# Patient Record
Sex: Female | Born: 1980 | Race: Black or African American | Hispanic: No | Marital: Married | Smoking: Never smoker
Health system: Southern US, Community
[De-identification: ages and names within clinical notes are randomized; demographics above are authoritative.]

## PROBLEM LIST (undated history)

## (undated) DIAGNOSIS — E079 Disorder of thyroid, unspecified: Secondary | ICD-10-CM

## (undated) DIAGNOSIS — D649 Anemia, unspecified: Secondary | ICD-10-CM

## (undated) HISTORY — PX: NO PAST SURGERIES: SHX2092

## (undated) HISTORY — PX: OTHER SURGICAL HISTORY: SHX169

---

## 2000-08-13 ENCOUNTER — Ambulatory Visit (HOSPITAL_COMMUNITY): Admission: RE | Admit: 2000-08-13 | Discharge: 2000-08-13 | Payer: Self-pay | Admitting: *Deleted

## 2000-08-21 ENCOUNTER — Encounter: Admission: RE | Admit: 2000-08-21 | Discharge: 2000-08-21 | Payer: Self-pay | Admitting: Obstetrics

## 2000-09-03 ENCOUNTER — Encounter: Admission: RE | Admit: 2000-09-03 | Discharge: 2000-09-03 | Payer: Self-pay | Admitting: Obstetrics & Gynecology

## 2000-09-16 ENCOUNTER — Ambulatory Visit (HOSPITAL_COMMUNITY): Admission: RE | Admit: 2000-09-16 | Discharge: 2000-09-16 | Payer: Self-pay | Admitting: Obstetrics & Gynecology

## 2000-09-17 ENCOUNTER — Encounter: Admission: RE | Admit: 2000-09-17 | Discharge: 2000-09-17 | Payer: Self-pay | Admitting: Obstetrics & Gynecology

## 2000-10-01 ENCOUNTER — Encounter: Admission: RE | Admit: 2000-10-01 | Discharge: 2000-10-01 | Payer: Self-pay | Admitting: Obstetrics & Gynecology

## 2000-10-08 ENCOUNTER — Ambulatory Visit (HOSPITAL_COMMUNITY): Admission: RE | Admit: 2000-10-08 | Discharge: 2000-10-08 | Payer: Self-pay | Admitting: Obstetrics & Gynecology

## 2000-10-08 ENCOUNTER — Encounter: Admission: RE | Admit: 2000-10-08 | Discharge: 2000-10-08 | Payer: Self-pay | Admitting: Obstetrics & Gynecology

## 2000-10-14 ENCOUNTER — Encounter (HOSPITAL_COMMUNITY): Admission: RE | Admit: 2000-10-14 | Discharge: 2000-10-27 | Payer: Self-pay | Admitting: *Deleted

## 2000-10-23 ENCOUNTER — Encounter: Payer: Self-pay | Admitting: *Deleted

## 2000-10-25 ENCOUNTER — Encounter: Payer: Self-pay | Admitting: Obstetrics

## 2000-10-25 ENCOUNTER — Inpatient Hospital Stay (HOSPITAL_COMMUNITY): Admission: AD | Admit: 2000-10-25 | Discharge: 2000-10-27 | Payer: Self-pay | Admitting: *Deleted

## 2000-10-25 ENCOUNTER — Encounter (INDEPENDENT_AMBULATORY_CARE_PROVIDER_SITE_OTHER): Payer: Self-pay | Admitting: Specialist

## 2000-10-29 ENCOUNTER — Encounter: Admission: RE | Admit: 2000-10-29 | Discharge: 2000-11-28 | Payer: Self-pay | Admitting: Obstetrics

## 2004-04-20 ENCOUNTER — Inpatient Hospital Stay (HOSPITAL_COMMUNITY): Admission: AD | Admit: 2004-04-20 | Discharge: 2004-04-20 | Payer: Self-pay | Admitting: *Deleted

## 2004-04-21 ENCOUNTER — Inpatient Hospital Stay (HOSPITAL_COMMUNITY): Admission: AD | Admit: 2004-04-21 | Discharge: 2004-04-23 | Payer: Self-pay | Admitting: Obstetrics

## 2007-09-26 ENCOUNTER — Inpatient Hospital Stay (HOSPITAL_COMMUNITY): Admission: AD | Admit: 2007-09-26 | Discharge: 2007-09-26 | Payer: Self-pay | Admitting: Obstetrics & Gynecology

## 2007-11-09 ENCOUNTER — Inpatient Hospital Stay (HOSPITAL_COMMUNITY): Admission: AD | Admit: 2007-11-09 | Discharge: 2007-11-11 | Payer: Self-pay | Admitting: Obstetrics & Gynecology

## 2007-11-09 ENCOUNTER — Ambulatory Visit: Payer: Self-pay | Admitting: Obstetrics & Gynecology

## 2008-03-28 ENCOUNTER — Emergency Department (HOSPITAL_COMMUNITY): Admission: EM | Admit: 2008-03-28 | Discharge: 2008-03-29 | Payer: Self-pay | Admitting: Emergency Medicine

## 2010-09-09 NOTE — L&D Delivery Note (Signed)
Delivery Note At 8:05 AM a viable and healthy female was delivered via Vaginal, Spontaneous Delivery (ROA).  APGAR: 8, 9; weight 8 lb 13.5 oz (4010 g).   Placenta status: Intact, Spontaneous.  Cord: 3 vessels with the following complications: None.  Cord pH: n/a  Anesthesia: Epidural  Episiotomy: None Lacerations: None Suture Repair: n/a Est. Blood Loss (mL): 400  Mom to postpartum.  Baby to nursery-stable.  [facility] 04/06/2011, 8:26 AM

## 2010-11-30 ENCOUNTER — Ambulatory Visit (HOSPITAL_COMMUNITY)
Admission: AD | Admit: 2010-11-30 | Discharge: 2010-12-01 | DRG: 781 | Disposition: A | Discharge status: Home / Self Care | Payer: Medicaid Other | Source: Ambulatory Visit | Attending: Obstetrics & Gynecology | Admitting: Obstetrics & Gynecology

## 2010-11-30 ENCOUNTER — Inpatient Hospital Stay (HOSPITAL_COMMUNITY): Payer: Medicaid Other

## 2010-11-30 DIAGNOSIS — O99019 Anemia complicating pregnancy, unspecified trimester: Secondary | ICD-10-CM

## 2010-11-30 DIAGNOSIS — D509 Iron deficiency anemia, unspecified: Secondary | ICD-10-CM

## 2010-11-30 LAB — FERRITIN: Ferritin: 2 ng/mL — ABNORMAL LOW (ref 10–291)

## 2010-11-30 LAB — COMPREHENSIVE METABOLIC PANEL
Alkaline Phosphatase: 51 U/L (ref 39–117)
BUN: 5 mg/dL — ABNORMAL LOW (ref 6–23)
CO2: 22 mEq/L (ref 19–32)
Chloride: 105 mEq/L (ref 96–112)
Creatinine, Ser: 0.36 mg/dL — ABNORMAL LOW (ref 0.4–1.2)
GFR calc non Af Amer: >60 mL/min (ref >60)
Glucose, Bld: 66 mg/dL — ABNORMAL LOW (ref 70–99)
Potassium: 3.4 mEq/L — ABNORMAL LOW (ref 3.5–5.1)
Total Bilirubin: 0.1 mg/dL — ABNORMAL LOW (ref 0.3–1.2)

## 2010-11-30 LAB — HIV ANTIBODY (ROUTINE TESTING W REFLEX): HIV: NONREACTIVE

## 2010-11-30 LAB — DIFFERENTIAL
Basophils Absolute: 0 10*3/uL (ref 0.0–0.1)
Basophils Relative: 0 % (ref 0–1)
Eosinophils Absolute: 0.1 10*3/uL (ref 0.0–0.7)
Eosinophils Relative: 1 % (ref 0–5)
Lymphocytes Relative: 23 % (ref 12–46)
Lymphs Abs: 1.9 10*3/uL (ref 0.7–4.0)
Monocytes Absolute: 0.4 10*3/uL (ref 0.1–1.0)
Monocytes Relative: 5 % (ref 3–12)
Neutro Abs: 5.7 10*3/uL (ref 1.7–7.7)
Neutrophils Relative %: 71 % (ref 43–77)

## 2010-11-30 LAB — CBC
MCHC: 26.7 g/dL — ABNORMAL LOW (ref 30.0–36.0)
Platelets: 243 10*3/uL (ref 150–400)
RDW: 20.6 % — ABNORMAL HIGH (ref 11.5–15.5)
WBC: 8.1 10*3/uL (ref 4.0–10.5)

## 2010-11-30 LAB — IRON AND TIBC
Iron: 14 ug/dL — ABNORMAL LOW (ref 42–135)
TIBC: 215 ug/dL — ABNORMAL LOW (ref 250–470)

## 2010-11-30 LAB — VITAMIN B12: Vitamin B-12: 338 pg/mL (ref 211–911)

## 2010-11-30 LAB — TSH: TSH: 3.361 u[IU]/mL (ref 0.350–4.500)

## 2010-11-30 LAB — RETICULOCYTES
RBC.: 3.71 MIL/uL — ABNORMAL LOW (ref 3.87–5.11)
Retic Count, Absolute: 55.7 10*3/uL (ref 19.0–186.0)

## 2010-11-30 LAB — RPR: RPR Ser Ql: NONREACTIVE

## 2010-12-03 ENCOUNTER — Ambulatory Visit (HOSPITAL_COMMUNITY)
Admission: AD | Admit: 2010-12-03 | Discharge: 2010-12-03 | Disposition: A | Discharge status: Home / Self Care | Payer: Medicaid Other | Source: Ambulatory Visit | Attending: Obstetrics & Gynecology | Admitting: Obstetrics & Gynecology

## 2010-12-03 DIAGNOSIS — D649 Anemia, unspecified: Secondary | ICD-10-CM | POA: Insufficient documentation

## 2010-12-03 DIAGNOSIS — O99019 Anemia complicating pregnancy, unspecified trimester: Principal | ICD-10-CM | POA: Insufficient documentation

## 2010-12-04 LAB — CROSSMATCH
Unit division: 0
Unit division: 0

## 2010-12-05 ENCOUNTER — Ambulatory Visit (HOSPITAL_COMMUNITY)
Admission: AD | Admit: 2010-12-05 | Discharge: 2010-12-05 | Disposition: A | Discharge status: Home / Self Care | Payer: Medicaid Other | Source: Ambulatory Visit | Attending: Obstetrics and Gynecology | Admitting: Obstetrics and Gynecology

## 2010-12-05 DIAGNOSIS — D649 Anemia, unspecified: Secondary | ICD-10-CM | POA: Insufficient documentation

## 2010-12-05 DIAGNOSIS — O99019 Anemia complicating pregnancy, unspecified trimester: Principal | ICD-10-CM | POA: Insufficient documentation

## 2010-12-17 NOTE — Discharge Summary (Signed)
Karen Berry, Karen Berry               ACCOUNT NO.:  [id number]  MEDICAL RECORD NO.:  [id number]           PATIENT TYPE:  I  LOCATION:  9371                          FACILITY:  [facility]  PHYSICIAN:  Horton Chin, MD DATE OF BIRTH:  May 14, 1981  DATE OF ADMISSION:  11/30/2010 DATE OF DISCHARGE:  12/01/2010                              DISCHARGE SUMMARY   ADMISSION DIAGNOSES: 1. Intrauterine pregnancy at 22-2/7th weeks' gestation, estimated due     date of April 03, 2011. 2. Symptomatic anemia with a hemoglobin of 5.8 and hematocrit of 21.7.  DISCHARGE DIAGNOSES: 1. Intrauterine pregnancy at 22-3/7th weeks' gestation. 2. The patient declined transfusion. 3. Status post intravenous iron infusion.  PERTINENT LABS AND STUDIES:  Admission hemoglobin of 5.8, hematocrit 21.7, platelet count of 243,000, retic count of 1.5%.  Vitamin B12 338 which is within normal limits.  Iron 14, total iron binding capacity of 215, percent saturation of 7, ferritin of 2 which confirms iron  deficiency anemia. Hemoglobin B surface antigen negative, RPR nonreactive, rubella immune.  Ultrasound that was done on November 30, 2010 showed an estimated fetal weight of 568 g in the 62nd percentile.  Anterior placenta, normal placenta cord insertion, normal amniotic fluid, fetus appears to be female, normal basic anatomy, cervix length 4.7 cm with normal appearance.  BRIEF HOSPITAL COURSE:  The patient is a 31 year old gravida 4, para 4-0- 0-5 who was seen in the MAU for report of fatigue and dizziness and palpitations.  The patient was sent from the health department and had late prenatal care.  At the health department yesterday given her symptoms, she was sent to maternity admissions for further evaluation and at that point, she was found to have a hemoglobin of 5.8 with the studies as reported concerning for iron deficiency anemia.  Also the patient did report having PICA.  Given her profound anemia and  symptoms, the patient was offered a blood transfusion, which she vehemently refused.  The second option that was given to her was intravenous iron infusion, which she agreed too.  She was admitted to the [facility] for this infusion.  She received Venofer 400 mg and tolerated the infusion well for over 2-1/2 hours.  The recommendation for her to get two additional doses of 400 mg spaced at least 48 hours apart.  The patient was hemodynamically stable while she was admitted and observed. Her baby had a heart rate in 140s.  She had no other obstetric symptoms or concerns.  Given that she was stable and still refusing a transfusion, the patient was sent home after her intravenous iron infusion.  DISCHARGE INSTRUCTIONS:  The patient was given routine antenatal discharge instructions.  She was also told to come in if she has any concerning symptoms such as bleeding or contractions, loss of fluid, decreased fetal movement.  The patient will come back on Monday, December 03, 2010 and Wednesday, December 05, 2010 for her subsequent Venofer infusions.  She was told to continue to think about possibly getting the transfusion, but she still refuses at this point.  Anemia precautions were given to the patient and she was  told to call or come back in for any further obstetric concerns.     Horton Chin, MD     UAA/MEDQ  D:  12/01/2010  T:  12/02/2010  Job:  [id number]  Electronically Signed by Jaynie Collins MD on 12/17/2010 11:42:11 AM

## 2011-02-01 ENCOUNTER — Inpatient Hospital Stay (HOSPITAL_COMMUNITY)
Admission: AD | Admit: 2011-02-01 | Discharge: 2011-02-01 | Disposition: A | Discharge status: Home / Self Care | Payer: Medicaid Other | Source: Ambulatory Visit | Attending: Obstetrics & Gynecology | Admitting: Obstetrics & Gynecology

## 2011-02-01 DIAGNOSIS — IMO0001 Reserved for inherently not codable concepts without codable children: Secondary | ICD-10-CM

## 2011-02-01 DIAGNOSIS — D649 Anemia, unspecified: Secondary | ICD-10-CM

## 2011-02-01 DIAGNOSIS — O99019 Anemia complicating pregnancy, unspecified trimester: Secondary | ICD-10-CM | POA: Insufficient documentation

## 2011-02-01 DIAGNOSIS — O99891 Other specified diseases and conditions complicating pregnancy: Secondary | ICD-10-CM | POA: Insufficient documentation

## 2011-02-01 DIAGNOSIS — O9989 Other specified diseases and conditions complicating pregnancy, childbirth and the puerperium: Secondary | ICD-10-CM

## 2011-02-01 LAB — URINALYSIS, ROUTINE W REFLEX MICROSCOPIC
Ketones, ur: 15 mg/dL — AB
Nitrite: NEGATIVE
Specific Gravity, Urine: 1.025 (ref 1.005–1.030)
Urobilinogen, UA: 0.2 mg/dL (ref 0.0–1.0)
pH: 6.5 (ref 5.0–8.0)

## 2011-02-01 LAB — DIFFERENTIAL
Basophils Relative: 0 % (ref 0–1)
Eosinophils Absolute: 0.1 10*3/uL (ref 0.0–0.7)
Eosinophils Relative: 2 % (ref 0–5)
Monocytes Absolute: 0.9 10*3/uL (ref 0.1–1.0)
Monocytes Relative: 10 % (ref 3–12)
Neutro Abs: 6 10*3/uL (ref 1.7–7.7)

## 2011-02-01 LAB — WET PREP, GENITAL
Clue Cells Wet Prep HPF POC: NONE SEEN
Trich, Wet Prep: NONE SEEN
Yeast Wet Prep HPF POC: NONE SEEN

## 2011-02-01 LAB — CBC
Hemoglobin: 10.7 g/dL — ABNORMAL LOW (ref 12.0–15.0)
MCH: 25.5 pg — ABNORMAL LOW (ref 26.0–34.0)
MCHC: 32.7 g/dL (ref 30.0–36.0)
Platelets: 196 10*3/uL (ref 150–400)
RDW: 26.2 % — ABNORMAL HIGH (ref 11.5–15.5)

## 2011-02-01 LAB — COMPREHENSIVE METABOLIC PANEL
Albumin: 2.4 g/dL — ABNORMAL LOW (ref 3.5–5.2)
Alkaline Phosphatase: 99 U/L (ref 39–117)
BUN: 7 mg/dL (ref 6–23)
Calcium: 8.5 mg/dL (ref 8.4–10.5)
Potassium: 3.3 mEq/L — ABNORMAL LOW (ref 3.5–5.1)
Total Protein: 5.9 g/dL — ABNORMAL LOW (ref 6.0–8.3)

## 2011-02-01 LAB — RETICULOCYTES
RBC.: 4.23 MIL/uL (ref 3.87–5.11)
Retic Count, Absolute: 59.2 10*3/uL (ref 19.0–186.0)

## 2011-02-02 LAB — IRON: Iron: 28 ug/dL — ABNORMAL LOW (ref 42–135)

## 2011-02-03 LAB — URINE CULTURE

## 2011-02-27 ENCOUNTER — Other Ambulatory Visit: Payer: Self-pay | Admitting: Advanced Practice Midwife

## 2011-02-27 ENCOUNTER — Ambulatory Visit: Payer: Medicaid Other

## 2011-02-27 ENCOUNTER — Other Ambulatory Visit: Payer: Self-pay | Admitting: Obstetrics and Gynecology

## 2011-02-27 DIAGNOSIS — O99019 Anemia complicating pregnancy, unspecified trimester: Secondary | ICD-10-CM

## 2011-02-27 LAB — POCT URINALYSIS DIP (DEVICE)
Bilirubin Urine: NEGATIVE
Glucose, UA: NEGATIVE mg/dL
Hgb urine dipstick: NEGATIVE
Ketones, ur: NEGATIVE mg/dL
Specific Gravity, Urine: 1.02 (ref 1.005–1.030)
pH: 6.5 (ref 5.0–8.0)

## 2011-03-11 ENCOUNTER — Other Ambulatory Visit: Payer: Self-pay | Admitting: Obstetrics and Gynecology

## 2011-03-11 DIAGNOSIS — O99019 Anemia complicating pregnancy, unspecified trimester: Secondary | ICD-10-CM

## 2011-03-11 DIAGNOSIS — O0990 Supervision of high risk pregnancy, unspecified, unspecified trimester: Secondary | ICD-10-CM

## 2011-03-20 ENCOUNTER — Other Ambulatory Visit: Payer: Self-pay | Admitting: Obstetrics & Gynecology

## 2011-03-20 DIAGNOSIS — D509 Iron deficiency anemia, unspecified: Secondary | ICD-10-CM

## 2011-03-20 DIAGNOSIS — O0930 Supervision of pregnancy with insufficient antenatal care, unspecified trimester: Secondary | ICD-10-CM

## 2011-03-27 ENCOUNTER — Other Ambulatory Visit: Payer: Self-pay | Admitting: Obstetrics and Gynecology

## 2011-03-27 DIAGNOSIS — O0930 Supervision of pregnancy with insufficient antenatal care, unspecified trimester: Secondary | ICD-10-CM

## 2011-03-27 LAB — POCT URINALYSIS DIP (DEVICE)
Bilirubin Urine: NEGATIVE
Ketones, ur: NEGATIVE mg/dL
Protein, ur: NEGATIVE mg/dL
Specific Gravity, Urine: 1.015 (ref 1.005–1.030)
pH: 7 (ref 5.0–8.0)

## 2011-04-03 ENCOUNTER — Other Ambulatory Visit: Payer: Self-pay | Admitting: Family

## 2011-04-03 ENCOUNTER — Other Ambulatory Visit: Payer: Self-pay | Admitting: Physician Assistant

## 2011-04-03 DIAGNOSIS — IMO0002 Reserved for concepts with insufficient information to code with codable children: Secondary | ICD-10-CM

## 2011-04-03 DIAGNOSIS — O480 Post-term pregnancy: Secondary | ICD-10-CM

## 2011-04-03 DIAGNOSIS — D509 Iron deficiency anemia, unspecified: Secondary | ICD-10-CM

## 2011-04-03 DIAGNOSIS — O0930 Supervision of pregnancy with insufficient antenatal care, unspecified trimester: Secondary | ICD-10-CM

## 2011-04-03 DIAGNOSIS — O365990 Maternal care for other known or suspected poor fetal growth, unspecified trimester, not applicable or unspecified: Secondary | ICD-10-CM

## 2011-04-03 LAB — POCT URINALYSIS DIP (DEVICE)
Hgb urine dipstick: NEGATIVE
Ketones, ur: NEGATIVE mg/dL
Protein, ur: NEGATIVE mg/dL
Specific Gravity, Urine: 1.015 (ref 1.005–1.030)
Urobilinogen, UA: 0.2 mg/dL (ref 0.0–1.0)

## 2011-04-04 ENCOUNTER — Ambulatory Visit (HOSPITAL_COMMUNITY)
Admission: RE | Admit: 2011-04-04 | Discharge: 2011-04-04 | Disposition: A | Discharge status: Home / Self Care | Payer: Medicaid Other | Source: Ambulatory Visit | Attending: Family | Admitting: Family

## 2011-04-04 DIAGNOSIS — IMO0002 Reserved for concepts with insufficient information to code with codable children: Secondary | ICD-10-CM

## 2011-04-04 DIAGNOSIS — O480 Post-term pregnancy: Secondary | ICD-10-CM | POA: Insufficient documentation

## 2011-04-04 DIAGNOSIS — Z3689 Encounter for other specified antenatal screening: Secondary | ICD-10-CM | POA: Insufficient documentation

## 2011-04-05 ENCOUNTER — Encounter (HOSPITAL_COMMUNITY): Payer: Self-pay | Admitting: Anesthesiology

## 2011-04-05 ENCOUNTER — Inpatient Hospital Stay (HOSPITAL_COMMUNITY): Payer: Medicaid Other | Admitting: Anesthesiology

## 2011-04-05 ENCOUNTER — Inpatient Hospital Stay (HOSPITAL_COMMUNITY)
Admission: AD | Admit: 2011-04-05 | Discharge: 2011-04-08 | DRG: 775 | Disposition: A | Discharge status: Home / Self Care | Payer: Medicaid Other | Source: Ambulatory Visit | Attending: Obstetrics & Gynecology | Admitting: Obstetrics & Gynecology

## 2011-04-05 ENCOUNTER — Encounter (HOSPITAL_COMMUNITY): Payer: Self-pay | Admitting: *Deleted

## 2011-04-05 DIAGNOSIS — IMO0001 Reserved for inherently not codable concepts without codable children: Secondary | ICD-10-CM

## 2011-04-05 HISTORY — DX: Anemia, unspecified: D64.9

## 2011-04-05 LAB — CBC
HCT: 39.2 % (ref 36.0–46.0)
MCV: 82.4 fL (ref 78.0–100.0)
RBC: 4.76 MIL/uL (ref 3.87–5.11)
WBC: 10.3 10*3/uL (ref 4.0–10.5)

## 2011-04-05 LAB — RPR: RPR: NONREACTIVE

## 2011-04-05 MED ORDER — LACTATED RINGERS IV SOLN
INTRAVENOUS | Status: DC
  Administered 2011-04-05 (×2): via INTRAVENOUS
  Administered 2011-04-05: 125 mL/h via INTRAVENOUS
  Administered 2011-04-06: 04:00:00 via INTRAVENOUS

## 2011-04-05 MED ORDER — LIDOCAINE HCL 1.5 % IJ SOLN
INTRAMUSCULAR | Status: DC | PRN
  Administered 2011-04-05: 5 mL via INTRADERMAL
  Administered 2011-04-05: 3 mL via INTRADERMAL
  Administered 2011-04-05: 5 mL via INTRADERMAL
  Administered 2011-04-05: 2 mL via INTRADERMAL

## 2011-04-05 MED ORDER — LACTATED RINGERS IV SOLN
500.0000 mL | INTRAVENOUS | Status: DC | PRN
  Administered 2011-04-06: 250 mL via INTRAVENOUS

## 2011-04-05 MED ORDER — LACTATED RINGERS IV SOLN
500.0000 mL | Freq: Once | INTRAVENOUS | Status: AC
  Administered 2011-04-05: 1000 mL via INTRAVENOUS

## 2011-04-05 MED ORDER — CITRIC ACID-SODIUM CITRATE 334-500 MG/5ML PO SOLN: 30.0000 mL | ORAL | Status: DC | PRN

## 2011-04-05 MED ORDER — DIPHENHYDRAMINE HCL 50 MG/ML IJ SOLN: 12.5000 mg | INTRAMUSCULAR | Status: DC | PRN

## 2011-04-05 MED ORDER — PHENYLEPHRINE 40 MCG/ML (10ML) SYRINGE FOR IV PUSH (FOR BLOOD PRESSURE SUPPORT)
80.0000 ug | PREFILLED_SYRINGE | INTRAVENOUS | Status: DC | PRN
  Filled 2011-04-05 (×2): qty 5

## 2011-04-05 MED ORDER — FENTANYL 2.5 MCG/ML BUPIVACAINE 1/10 % EPIDURAL INFUSION (WH - ANES)
14.0000 mL/h | INTRAMUSCULAR | Status: DC
  Administered 2011-04-05 – 2011-04-06 (×4): 14 mL/h via EPIDURAL
  Filled 2011-04-05 (×4): qty 60

## 2011-04-05 MED ORDER — ZOLPIDEM TARTRATE 10 MG PO TABS: 10.0000 mg | ORAL_TABLET | Freq: Every evening | ORAL | Status: DC | PRN

## 2011-04-05 MED ORDER — ONDANSETRON HCL 4 MG/2ML IJ SOLN: 4.0000 mg | Freq: Four times a day (QID) | INTRAMUSCULAR | Status: DC | PRN

## 2011-04-05 MED ORDER — EPHEDRINE 5 MG/ML INJ
10.0000 mg | INTRAVENOUS | Status: DC | PRN
Start: 2011-04-05 — End: 2011-04-08
  Filled 2011-04-05: qty 4

## 2011-04-05 MED ORDER — LIDOCAINE HCL (PF) 1 % IJ SOLN
30.0000 mL | INTRAMUSCULAR | Status: DC | PRN
  Filled 2011-04-05 (×2): qty 30

## 2011-04-05 MED ORDER — ACETAMINOPHEN 325 MG PO TABS: 650.0000 mg | ORAL_TABLET | ORAL | Status: DC | PRN

## 2011-04-05 MED ORDER — OXYCODONE-ACETAMINOPHEN 5-325 MG PO TABS: 2.0000 | ORAL_TABLET | ORAL | Status: DC | PRN

## 2011-04-05 MED ORDER — OXYTOCIN 20 UNITS IN LACTATED RINGERS INFUSION - SIMPLE: 125.0000 mL/h | Freq: Once | INTRAVENOUS | Status: DC

## 2011-04-05 MED ORDER — OXYTOCIN 20 UNITS IN LACTATED RINGERS INFUSION - SIMPLE
1.0000 m[IU]/min | INTRAVENOUS | Status: DC
  Administered 2011-04-05: 2 m[IU]/min via INTRAVENOUS
  Filled 2011-04-05: qty 1000

## 2011-04-05 MED ORDER — PHENYLEPHRINE 40 MCG/ML (10ML) SYRINGE FOR IV PUSH (FOR BLOOD PRESSURE SUPPORT)
80.0000 ug | PREFILLED_SYRINGE | INTRAVENOUS | Status: DC | PRN
  Filled 2011-04-05: qty 5

## 2011-04-05 MED ORDER — IBUPROFEN 600 MG PO TABS
600.0000 mg | ORAL_TABLET | Freq: Four times a day (QID) | ORAL | Status: DC | PRN
  Filled 2011-04-05: qty 1

## 2011-04-05 MED ORDER — NALBUPHINE SYRINGE 5 MG/0.5 ML
5.0000 mg | INJECTION | INTRAMUSCULAR | Status: DC | PRN
  Filled 2011-04-05: qty 0.5

## 2011-04-05 MED ORDER — EPHEDRINE 5 MG/ML INJ
10.0000 mg | INTRAVENOUS | Status: DC | PRN
  Filled 2011-04-05 (×2): qty 4

## 2011-04-05 MED ORDER — FLEET ENEMA 7-19 GM/118ML RE ENEM: 1.0000 | ENEMA | RECTAL | Status: DC | PRN

## 2011-04-05 MED ORDER — SODIUM BICARBONATE 8.4 % IV SOLN
INTRAVENOUS | Status: DC | PRN
  Administered 2011-04-05: 3 mL via EPIDURAL

## 2011-04-05 NOTE — H&P (Signed)
Karen Berry is a 30 y.o. female, G5P4005 at 40.[redacted] wks EGA presenting for Contractions and bloody show since yesterday. She states the contractions are getting worse. She was late to prenatal care, but was seen in [facility] since [redacted] wks EGA. Pregnancy has been uncomplicated. Pt history taken with use of language line as patient's primary language is Arabic. Maternal Medical History:  Reason for admission: Reason for admission: contractions and vaginal bleeding.  Contractions: Onset was yesterday.   Frequency: regular.   Perceived severity is moderate.    Fetal activity: Perceived fetal activity is normal.   Last perceived fetal movement was within the past hour.    Prenatal complications: No bleeding, HIV, hypertension, pre-eclampsia or preterm labor.   Prenatal Complications - Diabetes: none.    OB History    Grav Para Term Preterm Abortions TAB SAB Ect Mult Living   5 4 4  0 0 0 0 0 2 5     Past Medical History  Diagnosis Date  . No pertinent past medical history    Past Surgical History  Procedure Date  . Female circumcision    Family History: family history is not on file. Social History:  reports that she has never smoked. She does not have any smokeless tobacco history on file. She reports that she does not drink alcohol or use illicit drugs.  Review of Systems  Constitutional: Negative.   HENT: Negative.   Eyes: Negative.   Respiratory: Negative.   Cardiovascular: Negative.   Gastrointestinal: Negative.   Genitourinary: Negative.   Musculoskeletal: Negative.   Skin: Negative.   Neurological: Negative.   Endo/Heme/Allergies: Negative.   Psychiatric/Behavioral: Negative.       Blood pressure 116/73, pulse 95, temperature 97.7 F (36.5 C), temperature source Oral, resp. rate 18, weight 135 lb (61.236 kg), last menstrual period 06/21/2010. Maternal Exam:  Uterine Assessment: Contraction strength is moderate.  Contraction frequency is regular.   Abdomen: Estimated fetal  weight is 8 lbs.   Fetal presentation: vertex  Introitus: Normal vulva. Normal vagina.  Ferning test: not done.   Pelvis: adequate for delivery.   Cervix: Cervix evaluated by digital exam.     Physical Exam  Constitutional: She is oriented to person, place, and time. She appears well-developed and well-nourished. She appears distressed.  HENT:  Head: Normocephalic.  Eyes: Pupils are equal, round, and reactive to light.  Neck: Normal range of motion.  Cardiovascular: Normal rate and regular rhythm.  Exam reveals no gallop and no friction rub.   Murmur heard. Respiratory: Effort normal and breath sounds normal. No respiratory distress. She has no wheezes. She has no rales. She exhibits no tenderness.  GI: Soft. Bowel sounds are normal. There is no tenderness. There is no rebound and no guarding.  Musculoskeletal: Normal range of motion.  Neurological: She is alert and oriented to person, place, and time. She has normal reflexes. She displays normal reflexes. No cranial nerve deficit.  Skin: Skin is warm and dry. No rash noted. She is not diaphoretic.  Psychiatric: She has a normal mood and affect.    Prenatal labs: ABO, Rh: O POS (03/23 1506) Antibody: NEG (03/23 1506) Rubella:   Immune RPR: NON REACTIVE (03/23 1421)  HBsAg: NEGATIVE (03/23 1421)  HIV:   Neg GBS:   Neg GC/Ch Neg/Neg 1 Hr GTT: 94  Assessment/Plan: Active Labor in 30-yo G5P4005 at 40.[redacted] wks EGA. GBS neg. Wishes to breast feed, wants an epidural, and plans on IUD or Implenon for birth control.  Darden Flemister N 04/05/2011, 2:04 PM

## 2011-04-05 NOTE — ED Notes (Signed)
Pt. Brought to room 7 from lobby for triage due to number of people waiting.

## 2011-04-05 NOTE — Anesthesia Preprocedure Evaluation (Signed)
Anesthesia Evaluation  Name, MR# and DOB Patient awake  General Assessment Comment  Reviewed: Allergy & Precautions, H&P , Patient's Chart, lab work & pertinent test results and reviewed documented beta blocker date and time   History of Anesthesia Complications Negative for: history of anesthetic complications  Airway Mallampati: III TM Distance: >3 FB     Dental   Pulmonaryneg pulmonary ROS    clear to auscultation    Cardiovascular regular Normal   Neuro/PsychNegative Neurological ROS Negative Psych ROS  GI/Hepatic/Renal negative GI ROS, negative Liver ROS, and negative Renal ROS (+)       Endo/Other  Negative Endocrine ROS (+)   Abdominal   Musculoskeletal  Hematology negative hematology ROS (+)   Peds  Reproductive/Obstetrics (+) Pregnancy   Anesthesia Other Findings             Anesthesia Physical Anesthesia Plan  ASA: II  Anesthesia Plan: Epidural   Post-op Pain Management:    Induction:   Airway Management Planned:   Additional Equipment:   Intra-op Plan:   Post-operative Plan:   Informed Consent: I have reviewed the patients History and Physical, chart, labs and discussed the procedure including the risks, benefits and alternatives for the proposed anesthesia with the patient or authorized representative who has indicated his/her understanding and acceptance.     Plan Discussed with:   Anesthesia Plan Comments:         Anesthesia Quick Evaluation

## 2011-04-05 NOTE — Progress Notes (Signed)
Contractions since yesterday. 1cm on Wed. In clinic. States having some spotting

## 2011-04-05 NOTE — Progress Notes (Signed)
Karen Berry is a 30 y.o. [id number] at [redacted]w[redacted]d by LMP admitted for contractions.  Subjective: Pt reports continued comfort with epidural.  No questions or concerns.    Objective: BP 93/65  Pulse 97  Temp(Src) 98.1 F (36.7 C) (Oral)  Resp 18  Wt 61.236 kg (135 lb)  SpO2 100%  LMP 06/21/2010     FHT:  FHR: 130's bpm, variability: moderate,  accelerations:  Present,  decelerations:  Absent UC:   regular, every 2-4 minutes SVE:   Dilation: 4 Effacement (%): 80 Station: -2 Exam by:: Roney Marion CNM  Labs: Lab Results  Component Value Date   WBC 10.3 04/05/2011   HGB 12.9 04/05/2011   HCT 39.2 04/05/2011   MCV 82.4 04/05/2011   PLT 195 04/05/2011    Assessment / Plan: Protracted latent phase  Labor: No Cervical change Preeclampsia:  n/a Fetal Wellbeing:  Category I Pain Control:  Epidural I/D:  n/a Anticipated MOD:  NSVD Begin pitocin augmentation.  [facility] 04/05/2011, 10:06 PM

## 2011-04-05 NOTE — Anesthesia Procedure Notes (Addendum)
Epidural Patient location during procedure: OB Start time: 04/05/2011 5:24 PM Reason for block: procedure for pain  Staffing Anesthesiologist: Rennie Rouch L. Performed by: anesthesiologist   Preanesthetic Checklist Completed: patient identified, site marked, surgical consent, pre-op evaluation, timeout performed, IV checked, risks and benefits discussed and monitors and equipment checked  Epidural Patient position: sitting Prep: site prepped and draped and DuraPrep Patient monitoring: continuous pulse ox, blood pressure and heart rate Approach: midline Injection technique: LOR air  Needle:  Needle type: Tuohy  Needle gauge: 17 G Needle length: 9 cm Needle insertion depth: 4 cm Catheter type: closed end flexible Catheter size: 19 Gauge Catheter at skin depth: 9 cm Test dose: negative  Assessment Events: blood not aspirated, injection not painful, no injection resistance, negative IV test and no paresthesia  Additional Notes Phone interpreter used.  Patient c/o "weird feeling in whole body" after 1st cath placed with test dose.  Cath removed and replaced at same level - then c/o "HA" with test dose.  Epi test dose given via second epidural with NO increase in HR.  Patient reports improvement in contraction pain.

## 2011-04-05 NOTE — Progress Notes (Signed)
Karen Berry is a 30 y.o. G5P4005 at [redacted]w[redacted]d by LMP admitted for contractions  Subjective: Pt reports feeling decrease in contractions since admission.  No questions or concerns.   Objective: BP 112/70  Pulse 93  Temp(Src) 98 F (36.7 C) (Oral)  Resp 19  Wt 61.236 kg (135 lb)  LMP 06/21/2010      FHT:  FHR: 120-130's bpm, variability: moderate,  accelerations:  Present,  decelerations:  Absent UC:   regular, every 2-4 minutes SVE:   Dilation: 3.5 Effacement (%): 50 Station: -2 Exam by:: Dr. Natale Milch  EFW 7-7.5 lbs  Labs: Lab Results  Component Value Date   WBC 10.3 04/05/2011   HGB 12.9 04/05/2011   HCT 39.2 04/05/2011   MCV 82.4 04/05/2011   PLT 195 04/05/2011    Assessment / Plan: Early Labor  Labor: Early Preeclampsia:  N/A Fetal Wellbeing:  Category I Pain Control:  Desires epidural I/D:  n/a Anticipated MOD:  NSVD  [facility] 04/05/2011, 4:21 PM

## 2011-04-05 NOTE — ED Notes (Signed)
To L&D via W/C.

## 2011-04-05 NOTE — Progress Notes (Signed)
Merial Moritz is a 30 y.o. [id number] at [redacted]w[redacted]d by LMP admitted for contractions.  Subjective: Pt reports comfortable after epidural.  Feeling increase in contractions.   No questions or concerns.   Objective: BP 100/60  Pulse 87  Temp(Src) 98 F (36.7 C) (Oral)  Resp 18  Wt 61.236 kg (135 lb)  SpO2 100%  LMP 06/21/2010    FHT:  FHR: 120's bpm, variability: moderate,  accelerations:  Present,  decelerations:  Absent UC:   regular, every 4.5 minutes SVE:   Dilation: 4 Effacement (%): 80 Station: -2 Exam by:: [employer] CNM  Labs: Lab Results  Component Value Date   WBC 10.3 04/05/2011   HGB 12.9 04/05/2011   HCT 39.2 04/05/2011   MCV 82.4 04/05/2011   PLT 195 04/05/2011    Assessment / Plan: Early Labor  Labor: Early Labor Preeclampsia:  N/A Fetal Wellbeing:  Category I Pain Control:  Epidural I/D:  n/a Anticipated MOD:  NSVD May have meal since still in early labor.  [facility] 04/05/2011, 7:17 PM

## 2011-04-06 ENCOUNTER — Encounter (HOSPITAL_COMMUNITY): Payer: Self-pay | Admitting: *Deleted

## 2011-04-06 MED ORDER — OXYCODONE-ACETAMINOPHEN 5-325 MG PO TABS
1.0000 | ORAL_TABLET | ORAL | Status: DC | PRN
  Administered 2011-04-06 – 2011-04-07 (×3): 1 via ORAL
  Filled 2011-04-06 (×3): qty 1

## 2011-04-06 MED ORDER — ZOLPIDEM TARTRATE 5 MG PO TABS: 5.0000 mg | ORAL_TABLET | Freq: Every evening | ORAL | Status: DC | PRN

## 2011-04-06 MED ORDER — WITCH HAZEL-GLYCERIN EX PADS: MEDICATED_PAD | CUTANEOUS | Status: DC | PRN

## 2011-04-06 MED ORDER — TETANUS-DIPHTH-ACELL PERTUSSIS 5-2.5-18.5 LF-MCG/0.5 IM SUSP: 0.5000 mL | Freq: Once | INTRAMUSCULAR | Status: DC

## 2011-04-06 MED ORDER — BENZOCAINE-MENTHOL 20-0.5 % EX AERO
1.0000 "application " | INHALATION_SPRAY | CUTANEOUS | Status: DC | PRN
  Administered 2011-04-06: 1 via TOPICAL

## 2011-04-06 MED ORDER — DIPHENHYDRAMINE HCL 25 MG PO CAPS: 25.0000 mg | ORAL_CAPSULE | Freq: Four times a day (QID) | ORAL | Status: DC | PRN

## 2011-04-06 MED ORDER — ONDANSETRON HCL 4 MG PO TABS: 4.0000 mg | ORAL_TABLET | ORAL | Status: DC | PRN

## 2011-04-06 MED ORDER — SENNOSIDES-DOCUSATE SODIUM 8.6-50 MG PO TABS
1.0000 | ORAL_TABLET | Freq: Every day | ORAL | Status: DC
  Administered 2011-04-06 – 2011-04-07 (×2): 1 via ORAL

## 2011-04-06 MED ORDER — PRENATAL PLUS 27-1 MG PO TABS: 1.0000 | ORAL_TABLET | Freq: Every day | ORAL | Status: DC

## 2011-04-06 MED ORDER — LANOLIN HYDROUS EX OINT: TOPICAL_OINTMENT | CUTANEOUS | Status: DC | PRN

## 2011-04-06 MED ORDER — IBUPROFEN 600 MG PO TABS
600.0000 mg | ORAL_TABLET | Freq: Four times a day (QID) | ORAL | Status: DC
  Administered 2011-04-06 – 2011-04-08 (×9): 600 mg via ORAL
  Filled 2011-04-06 (×7): qty 1

## 2011-04-06 MED ORDER — ONDANSETRON HCL 4 MG/2ML IJ SOLN: 4.0000 mg | INTRAMUSCULAR | Status: DC | PRN

## 2011-04-06 MED ORDER — PRENATAL PLUS 27-1 MG PO TABS
1.0000 | ORAL_TABLET | Freq: Every day | ORAL | Status: DC
  Administered 2011-04-06 – 2011-04-08 (×3): 1 via ORAL
  Filled 2011-04-06 (×3): qty 1

## 2011-04-06 MED ORDER — BENZOCAINE-MENTHOL 20-0.5 % EX AERO
INHALATION_SPRAY | CUTANEOUS | Status: AC
  Administered 2011-04-06: 1 via TOPICAL
  Filled 2011-04-06: qty 56

## 2011-04-06 MED ORDER — SIMETHICONE 80 MG PO CHEW: 80.0000 mg | CHEWABLE_TABLET | ORAL | Status: DC | PRN

## 2011-04-06 NOTE — Progress Notes (Signed)
Karen Berry is a 30 y.o. [id number] at [redacted]w[redacted]d by LMP admitted for contractions.   Subjective: Pt reports continued comfort after epidural.    Objective: BP 91/54  Pulse 96  Temp(Src) 99.3 F (37.4 C) (Axillary)  Resp 18  Wt 61.236 kg (135 lb)  SpO2 100%  LMP 06/21/2010   I/O this shift: In: 200 [P.O.:200] Out: 1400 [Urine:800; Blood:600]  FHT:  FHR: 130's bpm, variability: moderate,  accelerations:  Present,  decelerations:  Absent UC:   regular, every 2-4 minutes SVE:   Dilation: 4.5 Effacement (%): 80 Station: -2 Exam by:: L.Mears,RN  Labs: Lab Results  Component Value Date   WBC 10.3 04/05/2011   HGB 12.9 04/05/2011   HCT 39.2 04/05/2011   MCV 82.4 04/05/2011   PLT 195 04/05/2011    Assessment / Plan: Protracted latent phase  Labor: No cervical change Preeclampsia:  n/a Fetal Wellbeing:  Category I Pain Control:  Epidural I/D:  n/a Anticipated MOD:  NSVD  MUHAMMAD,Damica Gravlin 04/06/2011, 1:40 AM

## 2011-04-06 NOTE — Anesthesia Postprocedure Evaluation (Signed)
  Anesthesia Post-op Note  Patient: Karen Berry  Procedure(s) Performed: * No procedures listed *  Patient stable following vaginal delivery.

## 2011-04-06 NOTE — Progress Notes (Addendum)
  Karen Berry is a 30 y.o. [id number] at [redacted]w[redacted]d admitted for contractions.  Subjective:  Pt reports increased pressure with contractions.   Objective: BP 114/62  Pulse 84  Temp(Src) 97.8 F (36.6 C) (Oral)  Resp 16  Wt 61.236 kg (135 lb)  SpO2 100%  LMP 06/21/2010 I/O last 3 completed shifts: In: 200 [P.O.:200] Out: 1400 [Urine:800; Blood:600]    FHT:  FHR: 130's bpm, variability: moderate,  accelerations:  Present,  decelerations:  Absent UC:   regular, every 2-4 minutes SVE:   8.5 Effacement (%): 90 Station: -1 Exam by:: L.Mears,RN  Labs: Lab Results  Component Value Date   WBC 10.3 04/05/2011   HGB 12.9 04/05/2011   HCT 39.2 04/05/2011   MCV 82.4 04/05/2011   PLT 195 04/05/2011     Assessment / Plan: Augmentation of labor, progressing well  Labor: Progressing normally Preeclampsia:  n/a Fetal Wellbeing:  Category I Pain Control:  Epidural I/D:  n/a Anticipated MOD:  NSVD  [facility] 04/06/2011, 7:20 AM

## 2011-04-06 NOTE — Progress Notes (Addendum)
Karen Berry is a 30 y.o. [id number] at [redacted]w[redacted]d admitted for contractions.  Subjective:   Objective: BP 114/62  Pulse 84  Temp(Src) 97.8 F (36.6 C) (Oral)  Resp 16  Wt 61.236 kg (135 lb)  SpO2 100%  LMP 06/21/2010 I/O last 3 completed shifts: In: 200 [P.O.:200] Out: 1400 [Urine:800; Blood:600]    FHT:  FHR: 130's bpm, variability: moderate,  accelerations:  Present,  decelerations:  Present early decels UC:   regular, every 2-4 minutes SVE:   Dilation: Lip/rim Effacement (%): 90 Station: -1 Exam by:: L.Mears,RN  Labs: Lab Results  Component Value Date   WBC 10.3 04/05/2011   HGB 12.9 04/05/2011   HCT 39.2 04/05/2011   MCV 82.4 04/05/2011   PLT 195 04/05/2011     Assessment / Plan: Augmentation of labor, progressing well  Labor: Progressing normally Preeclampsia:  n/a Fetal Wellbeing:  Category II Pain Control:  Epidural I/D:  n/a Anticipated MOD:  NSVD  [facility] 04/06/2011, 7:18 AM

## 2011-04-06 NOTE — Progress Notes (Signed)
Karen Berry is a 30 y.o. [id number] at [redacted]w[redacted]d admitted for contractions.  Subjective:   Objective: BP 101/66  Pulse 108  Temp(Src) 98.5 F (36.9 C) (Oral)  Resp 16  Wt 61.236 kg (135 lb)  SpO2 100%  LMP 06/21/2010   I/O this shift: In: 200 [P.O.:200] Out: 1400 [Urine:800; Blood:600]  FHT:  FHR: 130's bpm, variability: moderate,  accelerations:  Present,  decelerations:  Absent UC:   regular, every 2-4 minutes SVE:   Dilation: 5 Effacement (%): 80 Station: -2 Exam by:: CNM  Labs: Lab Results  Component Value Date   WBC 10.3 04/05/2011   HGB 12.9 04/05/2011   HCT 39.2 04/05/2011   MCV 82.4 04/05/2011   PLT 195 04/05/2011   Attempted to place IUPC; head ballotable; defer until fetal descent  Assessment / Plan: Protracted active phase  Labor: Progressing on Pitocin, will continue to increase then AROM Preeclampsia:  n/a Fetal Wellbeing:  Category I Pain Control:  Epidural I/D:  n/a Anticipated MOD:  NSVD  [facility] 04/06/2011, 3:25 AM

## 2011-04-07 NOTE — Progress Notes (Signed)
Experienced breastfeeding mother x5, husband states she breast fed other children for 2 years. He was currently giving a bottle of formula and had given 60ml. Teaching about importance of giving only moms milk. Mother speaks very little english , father interpreted teaching.

## 2011-04-07 NOTE — Progress Notes (Signed)
Post Partum Day 1 Subjective: no complaints and tolerating PO, normal lochia, present BM, present flatus, plans to breastfeed, IUD  Objective: Blood pressure 100/64, pulse 71, temperature 97.7 F (36.5 C), temperature source Oral, resp. rate 18, weight 61.236 kg (135 lb), last menstrual period 06/21/2010, SpO2 100.00%, unknown if currently breastfeeding.  Physical Exam:  General: alert and cooperative Lochia: appropriate Chest: CTAB Heart: RRR no m/r/g Abdomen: +BS, soft, nontender,  Uterine Fundus: firm @ umbilicus DVT Evaluation: No evidence of DVT seen on physical exam. Extremities: no c/c/e   Basename 04/05/11 1439  HGB 12.9  HCT 39.2    Assessment/Plan: Plan for discharge tomorrow Continue routine pp care.   LOS: 2 days   Jenai Scaletta 04/07/2011, 7:04 AM

## 2011-04-08 MED ORDER — DOCUSATE SODIUM 100 MG PO CAPS: 100.0000 mg | ORAL_CAPSULE | Freq: Two times a day (BID) | ORAL | Status: AC

## 2011-04-08 MED ORDER — IBUPROFEN 600 MG PO TABS: 600.0000 mg | ORAL_TABLET | Freq: Four times a day (QID) | ORAL | Status: AC

## 2011-04-08 NOTE — Discharge Summary (Signed)
  Obstetric Discharge Summary Reason for Admission: onset of labor Prenatal Procedures: ultrasound Intrapartum Procedures: labor augmentation with pitocin Postpartum Procedures: none Complications-Operative and Postpartum: none  Hemoglobin  Date Value Range Status  04/05/2011 12.9  12.0-15.0 (g/dL) Final     HCT  Date Value Range Status  04/05/2011 39.2  36.0-46.0 (%) Final    Discharge Diagnoses: Term Pregnancy-delivered  Discharge Information: Date: 04/08/2011 Activity: pelvic rest Diet: routine Medications: Ibuprophen, Colace and Iron Condition: stable Instructions: refer to practice specific booklet Discharge to: home Follow-up Information    Follow up with [facility]. Make an appointment in 6 weeks.   Contact information:   [address] [place] [address]          Newborn Data: Live born  Information for the patient's newborn:  Aaleeyah, Bias [[id number]]  female ; APGAR 8/9 , ; weight 8lb13.4;  Home with mother.  Marena Chancy 04/08/2011, 7:43 AM

## 2011-04-08 NOTE — Progress Notes (Signed)
UR chart review completed.  

## 2011-04-08 NOTE — Addendum Note (Signed)
Addendum  created 04/08/11 1135 by Etrulia Zarr D Jenesys Casseus   Modules edited:Charges VN    

## 2011-04-08 NOTE — Progress Notes (Signed)
Mother speaks limited [place] and father speaks [place] well. This is the pt's 5th child and she has breast fed the others for 2 years. She also gives formula. Baby just breast fed and mother denies concerns or questions. Discussed the benefits of exclusively breast feeding.   Mother informed of post-discharge support and given phone number to the lactation department, including services for phone call assistance; out-patient appointments; and breastfeeding support group. List of other breastfeeding resources in the community given in the handout. Encouraged mother to call for problems or concerns related to breastfeeding.

## 2011-04-08 NOTE — Progress Notes (Signed)
  Post Partum Day 2 Subjective: no complaints, up ad lib, voiding, tolerating PO and abdominal pain controled. Breastfeeding and bottle feeding  Objective: Blood pressure 109/69, pulse 68, temperature 98 F (36.7 C), temperature source Oral, resp. rate 18, weight 135 lb (61.236 kg), last menstrual period 06/21/2010, SpO2 100.00%, unknown if currently breastfeeding.  Physical Exam:  General: alert, cooperative and no distress Lochia: appropriate Uterine Fundus: firm, at umbilicus DVT Evaluation: No evidence of DVT seen on physical exam. Negative Homan's sign.   Basename 04/05/11 1439  HGB 12.9  HCT 39.2    Assessment/Plan: Discharge home Contraception: IUD Breastfeeding and bottle feeding Discharge pt on colace, ibuprofen and iron   LOS: 3 days   [facility] 04/08/2011, 7:40 AM

## 2011-04-08 NOTE — Addendum Note (Signed)
Addendum  created 04/08/11 1134 by Makana Rostad D Stran Raper   Modules edited:Notes Section    

## 2011-04-08 NOTE — Addendum Note (Signed)
Addendum  created 04/08/11 1135 by Randa Spike   Modules edited:Charges VN

## 2011-04-08 NOTE — Anesthesia Postprocedure Evaluation (Signed)
  Anesthesia Post-op Note  Patient: IT consultant  Procedure(s) Performed: * No procedures listed *  Patient Location142  Anesthesia Type: Epidural  Level of Consciousness: awake  Airway and Oxygen Therapy: Patient Spontanous Breathing    Post-op Assessment: Post-op Vital signs reviewed  Post-op Vital Signs: Reviewed  Complications: No apparent anesthesia complications

## 2011-04-08 NOTE — Addendum Note (Signed)
Addendum  created 04/08/11 1134 by Randa Spike   Modules edited:Notes Section

## 2011-04-17 NOTE — Discharge Summary (Signed)
Agree with above note.  Keron Neenan 04/17/2011 4:40 PM    

## 2011-05-26 ENCOUNTER — Emergency Department (HOSPITAL_COMMUNITY)
Admission: EM | Admit: 2011-05-26 | Discharge: 2011-05-26 | Disposition: A | Discharge status: Home / Self Care | Payer: Medicaid Other | Attending: Emergency Medicine | Admitting: Emergency Medicine

## 2011-05-26 DIAGNOSIS — R1084 Generalized abdominal pain: Secondary | ICD-10-CM | POA: Insufficient documentation

## 2011-05-26 DIAGNOSIS — N390 Urinary tract infection, site not specified: Secondary | ICD-10-CM | POA: Insufficient documentation

## 2011-05-26 DIAGNOSIS — D649 Anemia, unspecified: Secondary | ICD-10-CM | POA: Insufficient documentation

## 2011-05-26 LAB — URINALYSIS, ROUTINE W REFLEX MICROSCOPIC
Ketones, ur: 15 mg/dL — AB
Protein, ur: 100 mg/dL — AB
Urobilinogen, UA: 0.2 mg/dL (ref 0.0–1.0)

## 2011-05-26 LAB — URINE MICROSCOPIC-ADD ON

## 2011-05-27 LAB — URINE CULTURE
Colony Count: >=100000
Culture  Setup Time: 201209170032

## 2011-05-30 LAB — URINALYSIS, ROUTINE W REFLEX MICROSCOPIC
Hgb urine dipstick: NEGATIVE
Ketones, ur: NEGATIVE
Protein, ur: NEGATIVE
Urobilinogen, UA: 0.2

## 2011-05-30 LAB — FETAL FIBRONECTIN: Fetal Fibronectin: NEGATIVE

## 2011-06-03 LAB — CBC
HCT: 37.8
MCHC: 32.3
Platelets: 248
RDW: 25.9 — ABNORMAL HIGH

## 2011-06-03 LAB — ABO/RH: ABO/RH(D): O POS

## 2011-06-03 LAB — TYPE AND SCREEN: Antibody Screen: NEGATIVE

## 2011-06-03 LAB — RPR: RPR Ser Ql: NONREACTIVE

## 2011-06-07 LAB — URINALYSIS, ROUTINE W REFLEX MICROSCOPIC
Bilirubin Urine: NEGATIVE
Glucose, UA: NEGATIVE
Hgb urine dipstick: NEGATIVE
Ketones, ur: NEGATIVE
Nitrite: NEGATIVE
Protein, ur: NEGATIVE
Specific Gravity, Urine: 1.028
Urobilinogen, UA: 0.2
pH: 6

## 2011-06-07 LAB — URINE MICROSCOPIC-ADD ON

## 2011-06-07 LAB — POCT PREGNANCY, URINE
Operator id: 24446
Preg Test, Ur: NEGATIVE

## 2011-06-07 LAB — WET PREP, GENITAL
Trich, Wet Prep: NONE SEEN
Yeast Wet Prep HPF POC: NONE SEEN

## 2011-06-07 LAB — GC/CHLAMYDIA PROBE AMP, GENITAL
Chlamydia, DNA Probe: NEGATIVE
GC Probe Amp, Genital: NEGATIVE

## 2011-06-09 ENCOUNTER — Emergency Department (HOSPITAL_COMMUNITY)
Admission: EM | Admit: 2011-06-09 | Discharge: 2011-06-09 | Disposition: A | Discharge status: Home / Self Care | Payer: Medicaid Other | Attending: Emergency Medicine | Admitting: Emergency Medicine

## 2011-06-09 DIAGNOSIS — N814 Uterovaginal prolapse, unspecified: Secondary | ICD-10-CM | POA: Insufficient documentation

## 2011-06-09 DIAGNOSIS — N9489 Other specified conditions associated with female genital organs and menstrual cycle: Secondary | ICD-10-CM | POA: Insufficient documentation

## 2011-06-09 DIAGNOSIS — N949 Unspecified condition associated with female genital organs and menstrual cycle: Secondary | ICD-10-CM | POA: Insufficient documentation

## 2011-06-09 LAB — POCT PREGNANCY, URINE: Preg Test, Ur: NEGATIVE

## 2011-06-09 LAB — URINALYSIS, ROUTINE W REFLEX MICROSCOPIC
Hgb urine dipstick: NEGATIVE
Ketones, ur: 15 mg/dL — AB
Nitrite: NEGATIVE
Specific Gravity, Urine: 1.034 — ABNORMAL HIGH (ref 1.005–1.030)
Urobilinogen, UA: 0.2 mg/dL (ref 0.0–1.0)

## 2011-06-09 LAB — URINE MICROSCOPIC-ADD ON

## 2011-06-17 ENCOUNTER — Encounter (HOSPITAL_COMMUNITY): Payer: Self-pay | Admitting: *Deleted

## 2011-06-17 ENCOUNTER — Inpatient Hospital Stay (HOSPITAL_COMMUNITY)
Admission: AD | Admit: 2011-06-17 | Discharge: 2011-06-17 | Disposition: A | Discharge status: Home / Self Care | Payer: Medicaid Other | Source: Ambulatory Visit | Attending: Obstetrics & Gynecology | Admitting: Obstetrics & Gynecology

## 2011-06-17 DIAGNOSIS — IMO0002 Reserved for concepts with insufficient information to code with codable children: Secondary | ICD-10-CM | POA: Insufficient documentation

## 2011-06-17 DIAGNOSIS — N926 Irregular menstruation, unspecified: Secondary | ICD-10-CM

## 2011-06-17 DIAGNOSIS — O99893 Other specified diseases and conditions complicating puerperium: Secondary | ICD-10-CM | POA: Insufficient documentation

## 2011-06-17 LAB — URINALYSIS, ROUTINE W REFLEX MICROSCOPIC
Bilirubin Urine: NEGATIVE
Hgb urine dipstick: NEGATIVE
Nitrite: NEGATIVE
Specific Gravity, Urine: >1.03 — ABNORMAL HIGH (ref 1.005–1.030)
Urobilinogen, UA: 0.2 mg/dL (ref 0.0–1.0)
pH: 5.5 (ref 5.0–8.0)

## 2011-06-17 LAB — CBC
Hemoglobin: 12.8 g/dL (ref 12.0–15.0)
Platelets: 258 10*3/uL (ref 150–400)
RBC: 4.54 MIL/uL (ref 3.87–5.11)
WBC: 5.4 10*3/uL (ref 4.0–10.5)

## 2011-06-17 LAB — POCT PREGNANCY, URINE: Preg Test, Ur: NEGATIVE

## 2011-06-17 NOTE — ED Provider Notes (Signed)
History     Chief Complaint  Patient presents with  . Vaginal Bleeding   HPI SVE 04/06/11. Bled x  35 days after delivery, started bleeding again yesterday. Cramping yesterday, no pain today. Was seen at [facility] last week and diagnosed with prolapsed uterus. Has f/u in GYN clinic in 10/12.    OB History    Grav Para Term Preterm Abortions TAB SAB Ect Mult Living   5 5 5  0 0 0 0 0 2 6      Past Medical History  Diagnosis Date  . Anemia     Past Surgical History  Procedure Date  . Female circumcision   . No past surgeries     No family history on file.  History  Substance Use Topics  . Smoking status: Never Smoker   . Smokeless tobacco: Not on file  . Alcohol Use: No    Allergies: No Known Allergies  Prescriptions prior to admission  Medication Sig Dispense Refill  . fluconazole (DIFLUCAN) 150 MG tablet Take 150 mg by mouth once.        . IRON PO Take 1 tablet by mouth daily.        . prenatal vitamin w/FE, FA (PRENATAL 1 + 1) 27-1 MG TABS Take 1 tablet by mouth daily.          Review of Systems  Constitutional: Positive for malaise/fatigue.  Respiratory: Negative.   Cardiovascular: Negative.   Gastrointestinal: Negative for nausea, vomiting, abdominal pain, diarrhea and constipation.  Genitourinary: Negative for dysuria, urgency, frequency, hematuria and flank pain.       Negative cramping/contractions, Positive for vaginal bleeding  Musculoskeletal: Negative.   Neurological: Negative.   Psychiatric/Behavioral: Negative.    Physical Exam   Blood pressure 99/60, pulse 56, temperature 98 F (36.7 C), temperature source Oral, resp. rate 16, last menstrual period 06/08/2011, currently breastfeeding.  Physical Exam  Constitutional: She is oriented to person, place, and time. She appears well-developed and well-nourished. No distress.  Cardiovascular: Normal rate.   Respiratory: Effort normal.  Genitourinary:    There is no rash, tenderness, lesion or injury  on the right labia. There is no rash, tenderness, lesion or injury on the left labia. Vaginal discharge (clear mucous) found.       No evidence of uterine or cervical prolapse, cervix high  Neurological: She is alert and oriented to person, place, and time.  Skin: Skin is warm and dry.  Psychiatric: She has a normal mood and affect.    MAU Course  Procedures  Results for orders placed during the hospital encounter of 06/17/11 (from the past 24 hour(s))  CBC     Status: Normal   Collection Time   06/17/11 11:45 AM      Component Value Range   WBC 5.4  4.0 - 10.5 (K/uL)   RBC 4.54  3.87 - 5.11 (MIL/uL)   Hemoglobin 12.8  12.0 - 15.0 (g/dL)   HCT 95.6  21.3 - 08.6 (%)   MCV 85.0  78.0 - 100.0 (fL)   MCH 28.2  26.0 - 34.0 (pg)   MCHC 33.2  30.0 - 36.0 (g/dL)   RDW 57.8  46.9 - 62.9 (%)   Platelets 258  150 - 400 (K/uL)  URINALYSIS, ROUTINE W REFLEX MICROSCOPIC     Status: Abnormal   Collection Time   06/17/11 12:05 PM      Component Value Range   Color, Urine YELLOW  YELLOW    Appearance CLEAR  CLEAR    Specific Gravity, Urine >1.030 (*) 1.005 - 1.030    pH 5.5  5.0 - 8.0    Glucose, UA NEGATIVE  NEGATIVE (mg/dL)   Hgb urine dipstick NEGATIVE  NEGATIVE    Bilirubin Urine NEGATIVE  NEGATIVE    Ketones, ur NEGATIVE  NEGATIVE (mg/dL)   Protein, ur NEGATIVE  NEGATIVE (mg/dL)   Urobilinogen, UA 0.2  0.0 - 1.0 (mg/dL)   Nitrite NEGATIVE  NEGATIVE    Leukocytes, UA NEGATIVE  NEGATIVE   POCT PREGNANCY, URINE     Status: Normal   Collection Time   06/17/11 12:10 PM      Component Value Range   Preg Test, Ur NEGATIVE       Assessment and Plan  9 weeks postpartum, urethral irritation, f/u as scheduled in clinic  Zada Haser 06/17/2011, 12:09 PM

## 2011-06-17 NOTE — Progress Notes (Signed)
Pt/spouse state heavy bleeding/vaginal pain since 06/08/2011. Has felt weak and tired. No other vaginal d/c noted. 04/06/2011 had vaginal delivery, Dr Natale Milch

## 2011-06-21 ENCOUNTER — Ambulatory Visit (INDEPENDENT_AMBULATORY_CARE_PROVIDER_SITE_OTHER): Payer: Self-pay | Admitting: Advanced Practice Midwife

## 2011-06-21 ENCOUNTER — Encounter: Payer: Self-pay | Admitting: Advanced Practice Midwife

## 2011-06-21 NOTE — Patient Instructions (Signed)
IMPORTANT: HOW TO USE THIS INFORMATION:  This is a summary and does NOT have all possible information about this product. This information does not assure that this product is safe, effective, or appropriate for you. This information is not individual medical advice and does not substitute for the advice of your health care professional. Always ask your health care professional for complete information about this product and your specific health needs.    LEVONORGESTREL-RELEASING IMPLANT - INTRAUTERINE (lee-voh-nor-JEST-rell)    COMMON BRAND NAME(S): Mirena    USES:  This product is a small, flexible device that is placed in the womb (uterus) to prevent pregnancy. It is used in women who desire reversible birth control that works for a long time (up to 5 years). The device works by slowly releasing a hormone (levonorgestrel) that is similar to a certain substance made by a woman's body. This product is only intended for women who have previously given birth and have only one sexual partner. It is not meant for women with a history of certain infections/conditions (e.g., pelvic inflammatory disease, sexually transmitted disease, a certain problem pregnancy called ectopic pregnancy). For more information, consult your doctor. The use of this medication device does not protect you or your partner against sexually transmitted diseases (e.g., HIV, gonorrhea). Carefully read all of the information provided by your doctor, and ask any questions you may have about this product or other birth control methods that may be right for you.    HOW TO USE:  Read the Patient Information Leaflet provided by your pharmacist before this medication device is inserted and each time it is re-inserted. The leaflet contains very important information about side effects and when it is important to call your doctor. If you have any questions, consult your doctor or pharmacist. This product is inserted into your uterus by a properly  trained health care professional, usually once every 5 years or as determined by your doctor. The medication in the device is slowly released into the body over a 5-year period. Have a follow-up appointment 4-12 weeks after insertion of this product to check that it is still correctly in place. If you still desire birth control after 5 years, the medication device may be replaced with a new one. The medication device may also be removed at any time by a properly trained health care professional. Learn all the instructions on how and when to check this product and its proper positioning in your body, and make sure you understand the problems that may occur with this product. See also Precautions section.    SIDE EFFECTS:  Irregular vaginal bleeding (e.g., spotting), cramps, headache, nausea, breast pain, acne, rash, hair loss, weight gain, or decreased interest in sex may occur. If any of these effects persist or worsen, tell your doctor promptly. Remember that your doctor has prescribed this medication device because he or she has judged that the benefit to you is greater than the risk of side effects. Many people using this medication device do not have serious side effects. Tell your doctor immediately if any of these serious side effects occur: lack of menstrual period, unexplained fever, chills, trouble breathing, mental/mood changes (e.g., depression, nervousness), vaginal swelling/itching, painful intercourse. Tell your doctor immediately if any of these unlikely but serious side effects occur: migraine/severe headache, vomiting, tiredness, fast/pounding heartbeat. Tell your doctor immediately if any of these highly unlikely but very serious side effects occur: prolonged or heavy vaginal bleeding, unusual vaginal discharge/odor, vaginal sores, abdominal/pelvic pain or   tenderness, lumps in the breast, yellowing eyes/skin, dark urine, persistent nausea, trouble urinating. A very serious allergic reaction to  this drug is rare. However, seek immediate medical attention if you notice any of the following symptoms of a serious allergic reaction: rash, itching/swelling (especially of the face/tongue/throat), severe dizziness, trouble breathing. This is not a complete list of possible side effects. If you notice other effects not listed above, contact your doctor or pharmacist. In the [place] - Call your doctor for medical advice about side effects. You may report side effects to FDA at 1-800-FDA-1088. In [place] - Call your doctor for medical advice about side effects. You may report side effects to [employer] [place] at 1-866-234-2345.    PRECAUTIONS:  Before using this medication device, tell your doctor or pharmacist if you are allergic to levonorgestrel, or to any other progestins (e.g., norethindrone, desogestrel); or if you have any other allergies. This product may contain inactive ingredients, which can cause allergic reactions or other problems. Talk to your pharmacist for more details. This medication device should not be used if you have certain medical conditions. Before using this product, consult your doctor or pharmacist if you have: current known or suspected pregnancy, previous ectopic pregnancy, uterus problems (e.g., cancer, endometriosis, fibroids, pelvic inflammatory disease-PID), other IUD (intrauterine device) still in place, vaginal problems (e.g., infection), breast cancer, liver disease/tumors, any condition that affects your immune system (e.g., AIDS, leukemia). Before using this product, tell your doctor your medical history, especially of: bleeding problems (e.g., menstrual changes, clotting problems), heart problems (e.g., congenital valve conditions), high blood pressure, migraine headaches, stroke, diabetes. If you have diabetes, this medication may make it harder to control your blood sugar levels. Monitor your blood sugar regularly as directed by your doctor. Tell your doctor the results and any  symptoms such as increased thirst/urination. Your anti-diabetic medication or diet may need to be adjusted. This medication device may sometimes come out by itself or move out of place. This may result in unwanted pregnancy or other problems. After each menstrual period, check to make sure it is in the right place. Talk to your doctor about how to check your device. If it comes out or you cannot feel its threads, call your doctor promptly, and use a backup birth control method such as condoms. If you or partner has any other sexual partners, this medication device may no longer be a good choice for pregnancy prevention. If you or your partner becomes HIV positive, or if you think you may have been exposed to any sexually transmitted disease, contact your doctor immediately. You should consider having this device removed. This medication device must not be used during pregnancy. If you become pregnant or think you may be pregnant, tell your doctor immediately. If you have just given birth and are not breast-feeding, or if you have had a pregnancy loss or abortion after the 3 months of pregnancy, wait at least 6 weeks (or as directed by your doctor) before using this medication device. Consult your doctor about the problems that may occur during pregnancy while using this product. Levonorgestrel passes into breast milk. Consult your doctor before breast-feeding.    DRUG INTERACTIONS:  Your doctor or pharmacist may already be aware of any possible drug interactions and may be monitoring you for them. Do not start, stop, or change the dosage of any medicine before checking with them first. Before using this medication device, tell your doctor of all prescription and nonprescription medications you may use, especially   of: "blood thinners" (e.g., warfarin), birth control taken by mouth or applied to the skin (patch), certain drug used for varicose vein treatment (sodium tetradecyl sulfate), drugs that affect your immune  response (e.g., corticosteroids such as prednisone). This document does not contain all possible interactions. Therefore, before using this product, tell your doctor or pharmacist of all the products you use. Keep a list of all your medications with you, and share the list with your doctor and pharmacist.    OVERDOSE:  Overdose with this medication is very unlikely because of the way the drug is released from this device. Consult your doctor or pharmacist for more information.    NOTES:  Do not share this medication with others. Keep all appointments with your doctor and the laboratory. You should have regular complete physical exams including blood pressure, breast exam, pelvic exam, and screening for cervical cancer (Pap smear). Follow your doctor's instructions for examining your own breasts, and report any lumps immediately. Consult your doctor for more details.    MISSED DOSE:  Not applicable.    STORAGE:  Before use, store at room temperature at 77 degrees F (25 degrees C) away from light and moisture. Brief storage between 59-86 degrees F (15-30 degrees C) is permitted. Keep all medications and medical devices away from children and pets. Do not flush medications down the toilet or pour them into a drain unless instructed to do so. Properly discard this product when it is expired or no longer needed. Consult your pharmacist or local waste disposal company for more details about how to safely discard your product.    Information last revised June 2010 Copyright(c) 2010 [employer]      

## 2011-06-21 NOTE — Progress Notes (Signed)
  Subjective:    Patient ID: Karen Berry, female    DOB: 27-Mar-1981, 30 y.o.   MRN: [id number]  HPI  Presents for Postpartum exam. Had NSVD of a female infant with intact perineum. Stopped bleeding within 2 weeks. Breast and bottle feeding. Went to ED for complaint of a small vesicle which had appeared, opened on its own and drained. While there, was told by ED MD that she had a uterine prolapse. She went to [facility] where the provider found that she did not have any prolapse. They want a second opinion today. She is also requesting a Mirena IUD for contraception.   Review of Systems  Constitutional: Negative for fever and fatigue.  Genitourinary: Negative for difficulty urinating.       Objective:   Physical Exam  Constitutional: She is oriented to person, place, and time. She appears well-developed and well-nourished. No distress.  HENT:  Head: Normocephalic.  Pulmonary/Chest: Effort normal.  Abdominal: Soft. She exhibits no distension. There is no tenderness.  Genitourinary: No vaginal discharge found.       Vagina atraumatic with no evidence of lesions. 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 No evidence of prolapse or cystocele. Uterus and cervix well-supported. Uterus involuted.  Musculoskeletal: Normal range of motion.  Neurological: She is alert and oriented to person, place, and time.  Skin: Skin is warm and dry.  Psychiatric: She has a normal mood and affect.          Assessment & Plan:  A:  Normal Postpartum exam No evidence of prolapse.  Desires Mirena IUD.  P:  Reassured.  Will schedule IUD placement after verification of Medicaid coverage

## 2011-06-21 NOTE — Progress Notes (Signed)
  POSTPARTUM EXAM SUMMARY  SUBJECTIVE: Karen Berry is a [id number] who presents today for her 6 week postpartum visit after a NSVD without complications. The patient was seen in the [facility] approximately 2 weeks ago and told that she may have a uterine prolapse. She could not get an earlier appointment here in the clinic so she went to [facility] for re-evaluation of this issue. She was told in [facility] that she did not have a prolapse, but that she should keep her appointment here for re-evaluation. The patient has no complaints today. She has been successfully breast feeding and supplementing with bottle as needed. She denies pain or vaginal bleeding. She has been sexually active using condoms for contraception some of the time since delivery. She would like to discuss birth control options today.   Current Outpatient Prescriptions  Medication Sig Dispense Refill  . IRON PO Take 1 tablet by mouth daily.        Marland Kitchen oxyCODONE-acetaminophen (PERCOCET) 5-325 MG per tablet Take 1 tablet by mouth every 4 (four) hours as needed. For pain.       . prenatal vitamin w/FE, FA (PRENATAL 1 + 1) 27-1 MG TABS Take 1 tablet by mouth daily.         Past Medical History  Diagnosis Date  . Anemia    Past Surgical History  Procedure Date  . Female circumcision   . No past surgeries    History reviewed. No pertinent family history. History  Substance Use Topics  . Smoking status: Never Smoker   . Smokeless tobacco: Not on file  . Alcohol Use: No   History   Social History Narrative  . No narrative on file    Karen Berry is a 30 y.o. who is now 6 weeks postpartum. OB History    Grav Para Term Preterm Abortions TAB SAB Ect Mult Living   5 5 5  0 0 0 0 0 2 6     Method of delivery: normal spontaneous vaginal delivery She is breast-feeding and is not experiencing problems. Pregnancy complications: none. She is feeling well and happy. She currently uses condoms for contraception. She plans to use IUD for  contraception.  OBJECTIVE: Date of last Pap smear: 02/27/11 Physical Exam: GENERAL APPEARANCE: alert, well appearing, in no apparent distress LUNGS: clear to auscultation, no wheezes, rales or rhonchi, symmetric air entry HEART: regular rate and rhythm, no murmurs ABDOMEN POSTPARTUM: benign non-tender, without masses or organomegaly palpable EXTREMITIES: no redness or tenderness in the calves or thighs, no edema, intact peripheral pulses SKIN: normal coloration and turgor, no rashes, no suspicious skin lesions noted EXTERNAL GENITALIA POSTPARTUM: normal, well-healed, without lesions or masses VAGINA POSTPARTUM: normal, well-healed, physiologic discharge, without lesions CERVIX POSTPARTUM: normal, well-healed, without lesions UTERUS POSTPARTUM: normal size, well involuted, firm, non-tender  ASSESSMENT:  Normal postpartum exam  PLAN:   See orders and Patient Instructions Resume all normal activities  Contraceptive counseling for condoms, oral contraceptives (estrogen/progesterone), Depo-Provera, IUD. Patient has chosen to have an IUD inserted for contraception. Medicaid is inactive currently. Patient will call Medicaid office to confirm coverage and let us know of her status. She has completed the [employer] forms for a free device in the event that she is unable to re-activate her Medicaid. She has been told we will contact her to schedule her appointment when the device arrives in the office.

## 2011-08-21 ENCOUNTER — Ambulatory Visit (INDEPENDENT_AMBULATORY_CARE_PROVIDER_SITE_OTHER): Payer: Medicaid Other | Admitting: Advanced Practice Midwife

## 2011-08-21 ENCOUNTER — Encounter: Payer: Self-pay | Admitting: Advanced Practice Midwife

## 2011-08-21 VITALS — BP 108/65 | HR 85 | Temp 97.5°F | Ht 63.0 in | Wt 127.1 lb

## 2011-08-21 DIAGNOSIS — Z30430 Encounter for insertion of intrauterine contraceptive device: Secondary | ICD-10-CM

## 2011-08-21 DIAGNOSIS — Z975 Presence of (intrauterine) contraceptive device: Secondary | ICD-10-CM

## 2011-08-21 LAB — POCT PREGNANCY, URINE: Preg Test, Ur: NEGATIVE

## 2011-08-21 MED ORDER — IBUPROFEN 600 MG PO TABS: 600.0000 mg | ORAL_TABLET | Freq: Four times a day (QID) | ORAL | Status: AC | PRN

## 2011-08-21 MED ORDER — LEVONORGESTREL 20 MCG/24HR IU IUD: 1.0000 | INTRAUTERINE_SYSTEM | Freq: Once | INTRAUTERINE | Status: DC

## 2011-08-21 NOTE — Patient Instructions (Signed)
Intrauterine Device Insertion Care After Refer to this sheet in the next few weeks. These instructions provide you with information on caring for yourself after your procedure. Your caregiver may also give you more specific instructions. Your treatment has been planned according to current medical practices, but problems sometimes occur. Call your caregiver if you have any problems or questions after your procedure. HOME CARE INSTRUCTIONS   Only take over-the-counter or prescription medicines for pain, discomfort, or fever as directed by your caregiver. Do not use aspirin. This may increase bleeding.   Check your IUD to make sure it is in place before you resume sexual activity. You should be able to feel the strings. If you cannot feel the strings, something may be wrong. The IUD may have fallen out of the uterus, or the uterus may have been punctured (perforated) during placement. Also, if the strings are getting longer, it may mean that the IUD is being forced out of the uterus. You no longer have full protection from pregnancy if any of these problems occur.   You may resume sexual intercourse if you are not having problems with the IUD. The IUD is considered immediately effective.   You may resume normal activities.   Keep all follow-up appointments to be sure your IUD has remained in place. After the first exam, yearly exams are advised, unless you cannot feel the strings of your IUD.   Continue to check that the IUD is still in place by feeling for the strings after every menstrual period.  SEEK MEDICAL CARE IF:   You have bleeding that is heavier or lasts longer than a normal menstrual cycle.   You have a fever.   You have increasing cramps or abdominal pain not relieved with medicine.   You have abdominal pain that does not seem to be related to the same area of earlier cramping and pain.   You are lightheaded, unusually weak, or faint.   You have abnormal vaginal discharge or  smells.   You have pain during sexual intercourse.   You cannot feel the IUD strings, or the IUD string has gotten longer.   You feel the IUD at the opening of the cervix in the vagina.   You think you are pregnant, or you miss your menstrual period.   The IUD string is hurting your sex partner.  Document Released: 04/24/2011 Document Revised: 05/08/2011 Document Reviewed: 04/24/2011 [employer] Patient Information 2012 [employer] 

## 2011-08-21 NOTE — Progress Notes (Signed)
  Subjective:    Patient ID: Karen Berry, female    DOB: [birth year], 30 y.o.   MRN: [id number]  HPI    Review of Systems     Objective:   Physical Exam  Patient identified, informed consent performed, signed copy in chart, time out was performed.  Urine pregnancy test negative.  Speculum placed in the vagina.  Cervix visualized.  Cleaned with Betadine x 2.  Grasped anteriourly with a single tooth tenaculum.  Uterus sounded to 7.  Mirena IUD placed per manufacturer's recommendations.  Strings trimmed to 3 cm.   Patient given post procedure instructions and Mirena care card with expiration date.  Patient is asked to check IUD strings periodically and follow up in 4-6 weeks for IUD check.       Assessment & Plan:  F/U in 6 weeks  Dorathy Kinsman 08/21/2011 3:36 PM

## 2012-03-23 IMAGING — US US OB FOLLOW-UP
1 series · 12 of 28 positions shown · non-contrast
Comparison: none

[Series 1: us ob follow up · 28 acquisitions, 12 frames shown]
[im 2/28]
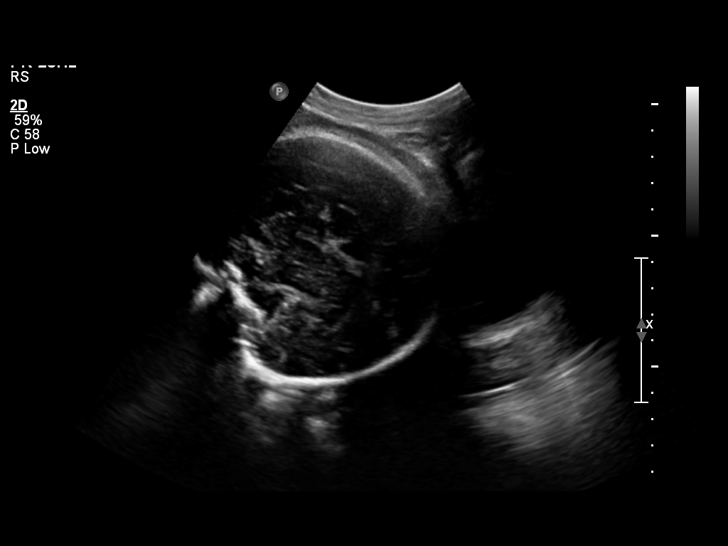
[im 4/28]
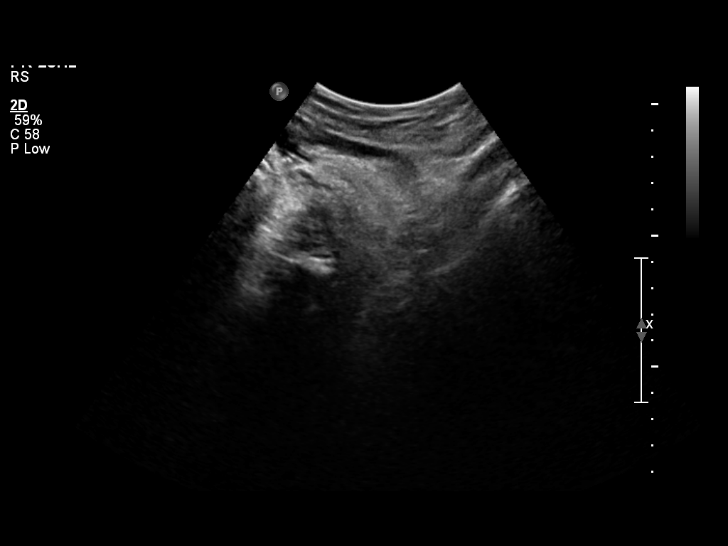
[im 6/28]
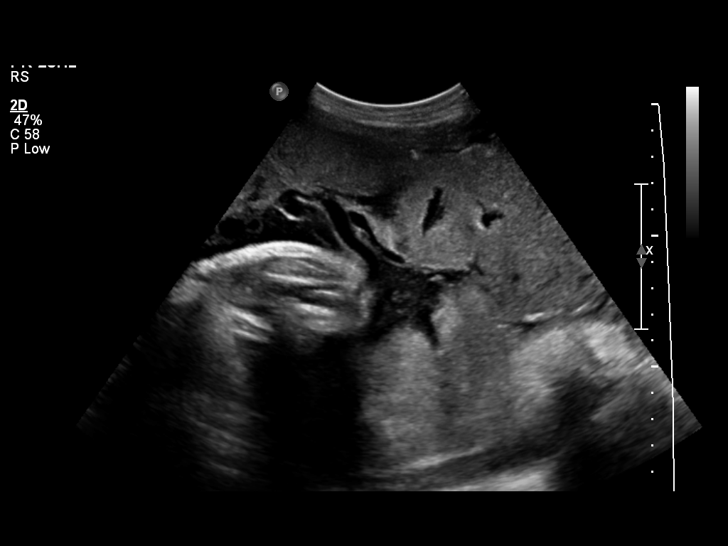
[im 9/28]
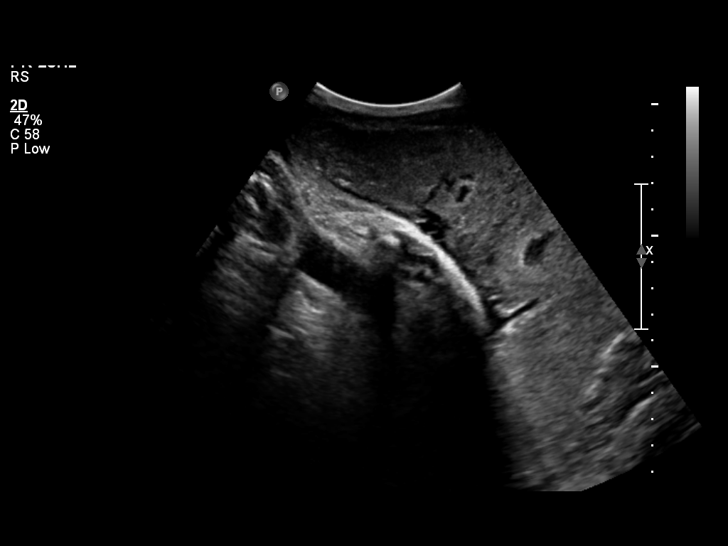
[im 11/28]
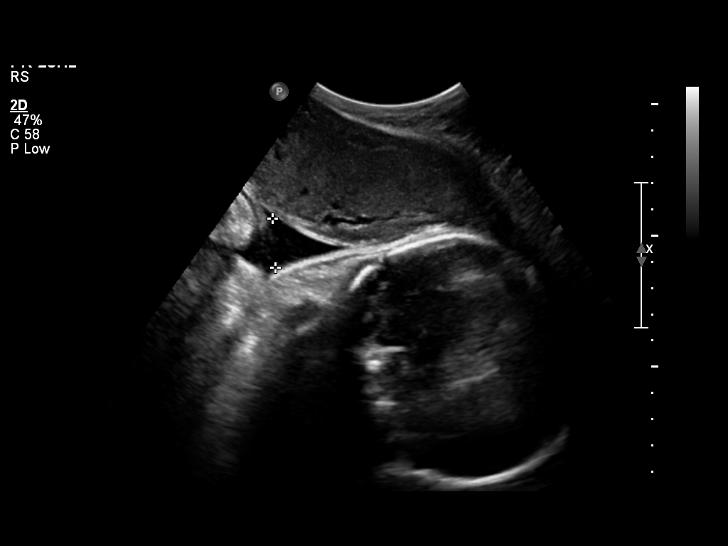
[im 13/28]
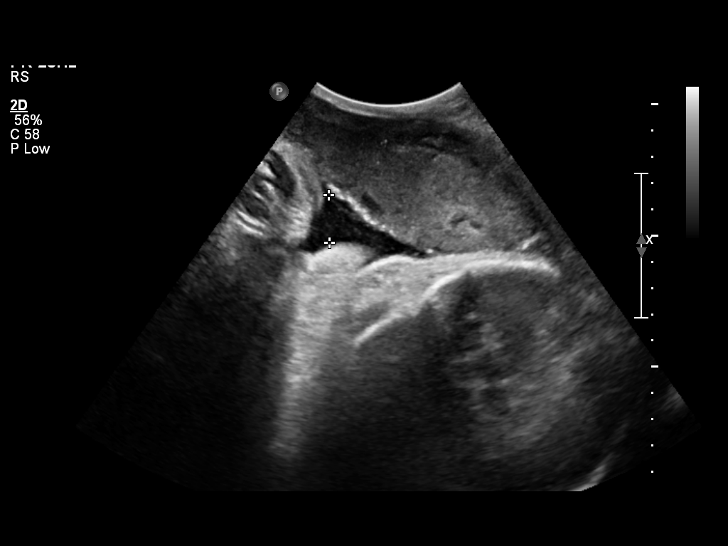
[im 16/28]
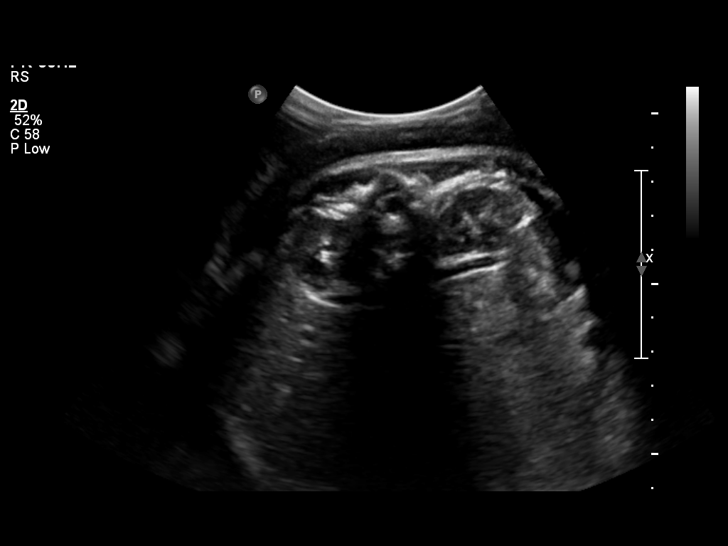
[im 18/28]
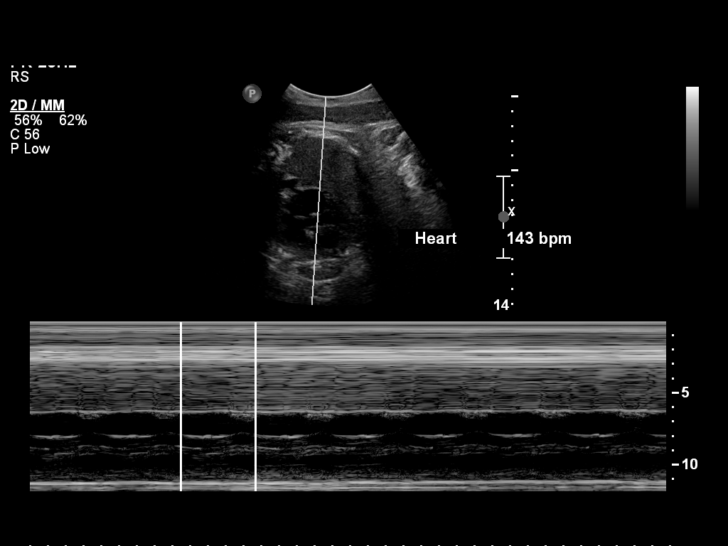
[im 20/28]
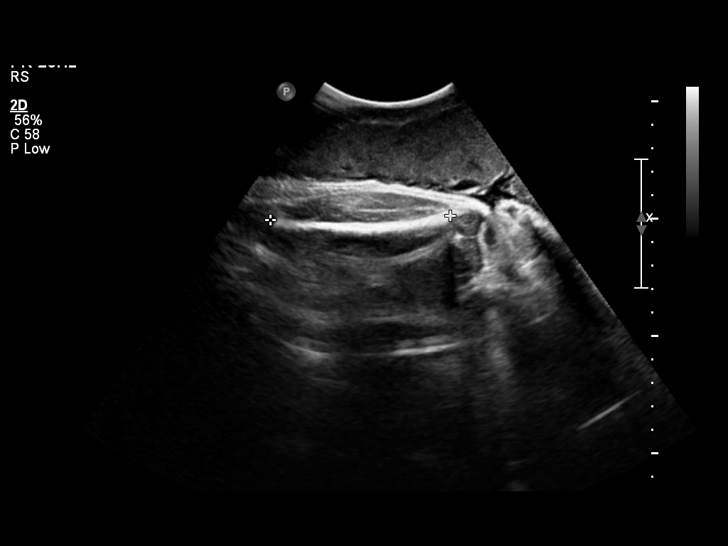
[im 23/28]
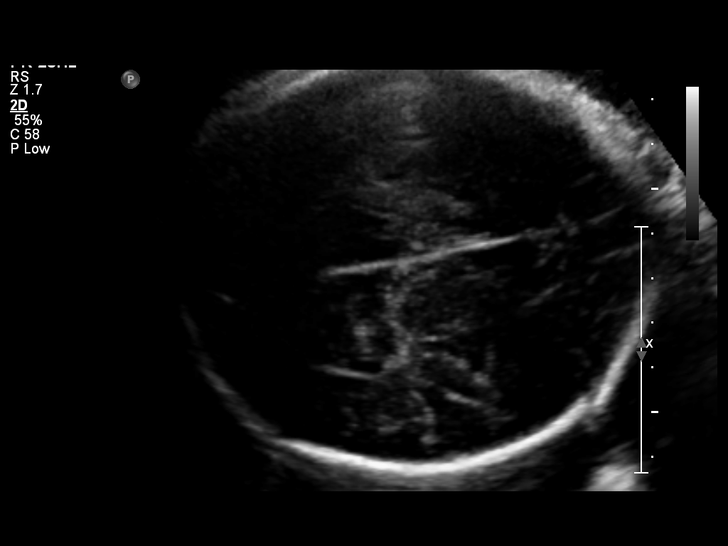
[im 25/28]
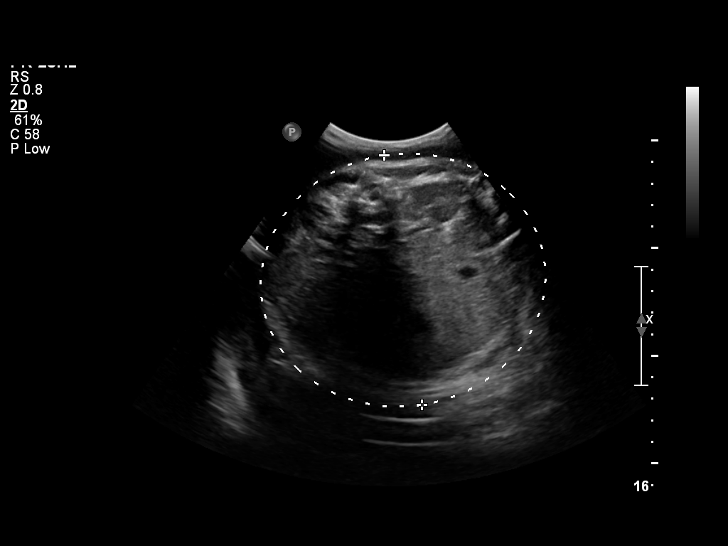
[im 27/28]
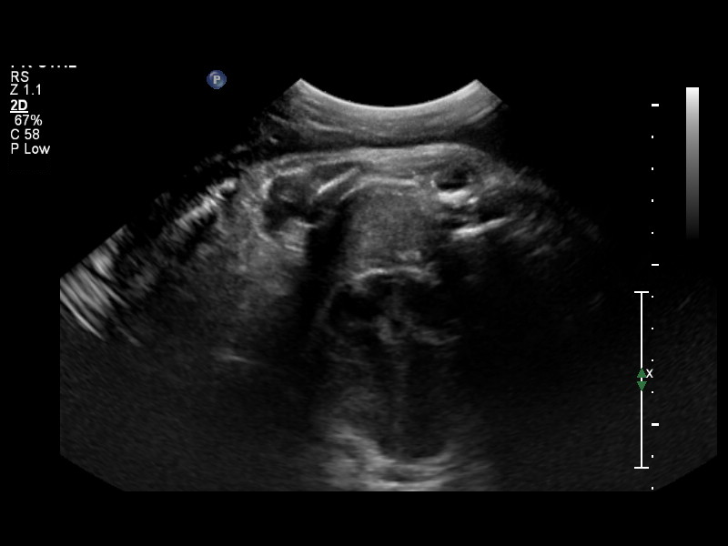

[12 of 28 positions shown; findings below may reference images not displayed]

OBSTETRICS REPORT
                      (Signed Final 04/04/2011 [DATE])

                 [facility]-
                 Faculty Physician
 Order#:         [id number]
                 70_O
Procedures

 US OB FOLLOW UP                                       76816.1
Indications

 Postdate pregnancy (40-42 weeks)
Fetal Evaluation

 Fetal Heart Rate:  143                          bpm
 Cardiac Activity:  Observed
 Presentation:      Cephalic
 Placenta:          Anterior Left, above
                    cervical os
 P. Cord            Previously Visualized
 Insertion:

 Amniotic Fluid
 AFI FV:      Subjectively decreased
 AFI Sum:     7.42    cm        8  %Tile     Larg Pckt:    2.59  cm
 RUQ:   1.16    cm   RLQ:    2.59   cm    LUQ:   1.86    cm   LLQ:    1.81   cm
Biophysical Evaluation

 Amniotic F.V:   Pocket => 2 cm two         F. Tone:        Observed
                 planes
 F. Movement:    Observed                   Score:          [DATE]
 F. Breathing:   Observed
Biometry

 BPD:     89.6  mm     G. Age:  36w 2d                CI:        68.08   70 - 86
                                                      FL/HC:      22.3   20.7 -

 HC:     347.3  mm     G. Age:  40w 2d                HC/AC:      0.89   0.87 -

 AC:     388.5  mm     G. Age:  N/A                   FL/BPD:     86.6   71 - 87
 FL:      77.6  mm     G. Age:  39w 5d                FL/AC:      20.0   20 - 24

 Est. FW:    0303  gm      9 lb 6 oz   > 90  %
Gestational Age

 LMP:           40w 1d        Date:  06/27/10                 EDD:   04/03/11
 U/S Today:     38w 5d                                        EDD:   04/13/11
 Best:          40w 1d     Det. By:  LMP  (06/27/10)          EDD:   04/03/11
Anatomy

 Cranium:           Appears normal      Aortic Arch:       Basic anatomy
                                                           exam per order
 Fetal Cavum:       Appears normal      Ductal Arch:       Basic anatomy
                                                           exam per order
 Ventricles:        Appears normal      Diaphragm:         Previously seen
 Choroid Plexus:    Previously seen     Stomach:           Appears
                                                           normal, left
                                                           sided
 Cerebellum:        Previously seen     Abdomen:           Appears normal
 Posterior Fossa:   Previously seen     Abdominal Wall:    Previously seen
 Nuchal Fold:       Previously seen     Cord Vessels:      Previously seen
 Face:              Lips previously     Kidneys:           Appear normal
                    seen
 Heart:             Appears normal      Bladder:           Appears normal
                    (4 chamber &
                    axis)
 RVOT:              Basic anatomy       Spine:             Previously seen
                    exam per order
 LVOT:              Basic anatomy       Limbs:             Previously seen
                    exam per order

 Other:     Fetus appears to be a male previously seen. Technically
            difficult due to advanced GA.
Cervix Uterus Adnexa

 Cervix:       Not visualized (advanced GA >34 wks)

 Adnexa:     No abnormality visualized.
Comments

 EFW range (+ / - 15%) =  3922 - 3773 grams
Impression

 Single intrauterine gestation demonstrating an estimated
 gestational age by ultrasound of 38w 5d. This is correlated
 with expected estimated gestational age by LMP of 40w 1d.
 The AC is larger than the remaining gestational indicators
 resulting in an EFW > 90% and compatible with a LGA fetus.
 With an EFW range exceeding 2355 grams, the possibility of
 underlying fetal macrosomia would need to be raised.

 No late developing fetal anatomic abnormalities are noted
 associated with the lateral ventricles, four chamber heart,
 stomach, kidneys or bladder.

 Subjectively decreased amniotic fluid volume with an AFI at
 the 8% with a single pocket measurement of > 2 x 2 cm
 noted.  .
 BPP [DATE].

 questions or concerns.

## 2012-06-06 ENCOUNTER — Encounter (HOSPITAL_COMMUNITY): Payer: Self-pay | Admitting: Emergency Medicine

## 2012-06-06 DIAGNOSIS — N898 Other specified noninflammatory disorders of vagina: Secondary | ICD-10-CM | POA: Insufficient documentation

## 2012-06-06 LAB — URINALYSIS, ROUTINE W REFLEX MICROSCOPIC
Ketones, ur: NEGATIVE mg/dL
Leukocytes, UA: NEGATIVE
Nitrite: NEGATIVE
Protein, ur: NEGATIVE mg/dL
Urobilinogen, UA: 0.2 mg/dL (ref 0.0–1.0)

## 2012-06-06 NOTE — ED Notes (Signed)
Family has to go home [phone] or [phone] [facility]. Call when ready to go home. Pt speaks arabic.

## 2012-06-06 NOTE — ED Notes (Signed)
Pt c/o vaginal itching x 5 days. Pt reports white discharge onset yesterday.

## 2012-06-07 ENCOUNTER — Emergency Department (HOSPITAL_COMMUNITY)
Admission: EM | Admit: 2012-06-07 | Discharge: 2012-06-07 | Discharge status: Against Medical Advice | Payer: Medicaid Other | Attending: Emergency Medicine | Admitting: Emergency Medicine

## 2012-06-07 NOTE — ED Notes (Signed)
No answer x2 

## 2012-06-25 ENCOUNTER — Encounter (HOSPITAL_COMMUNITY): Payer: Self-pay | Admitting: *Deleted

## 2012-06-25 ENCOUNTER — Emergency Department (INDEPENDENT_AMBULATORY_CARE_PROVIDER_SITE_OTHER)
Admission: EM | Admit: 2012-06-25 | Discharge: 2012-06-25 | Disposition: A | Discharge status: Home / Self Care | Payer: Medicaid Other | Source: Home / Self Care

## 2012-06-25 DIAGNOSIS — R000 Tachycardia, unspecified: Secondary | ICD-10-CM

## 2012-06-25 DIAGNOSIS — N390 Urinary tract infection, site not specified: Secondary | ICD-10-CM

## 2012-06-25 LAB — POCT URINALYSIS DIP (DEVICE)
Nitrite: NEGATIVE
Protein, ur: 100 mg/dL — AB
Urobilinogen, UA: 0.2 mg/dL (ref 0.0–1.0)

## 2012-06-25 MED ORDER — CEFTRIAXONE SODIUM 1 G IJ SOLR
INTRAMUSCULAR | Status: AC
  Filled 2012-06-25: qty 10

## 2012-06-25 MED ORDER — CEFTRIAXONE SODIUM 1 G IJ SOLR
1.0000 g | Freq: Once | INTRAMUSCULAR | Status: AC
  Administered 2012-06-25: 1 g via INTRAMUSCULAR

## 2012-06-25 MED ORDER — CEPHALEXIN 500 MG PO CAPS: 500.0000 mg | ORAL_CAPSULE | Freq: Four times a day (QID) | ORAL | Status: DC

## 2012-06-25 MED ORDER — LIDOCAINE HCL (PF) 1 % IJ SOLN
INTRAMUSCULAR | Status: AC
  Filled 2012-06-25: qty 5

## 2012-06-25 NOTE — ED Provider Notes (Signed)
History     CSN: [id number]  Arrival date & time 06/25/12  1926   None     Chief Complaint  Patient presents with  . Urinary Tract Infection    (Consider location/radiation/quality/duration/timing/severity/associated sxs/prior treatment) HPI Comments: 16. 31 year old female who speaks Arabic and husband is providing history. She is complaining of dysuria for 3 days. Some frequency. She is also having low right back pain and pain in this pubic area. She denies nausea or vomiting. She does say that she has had a recent fever but not taken with a thermometer.  2. today at 4 PM her husband describes an episode in which she experienced a fast heart rate and did not feel like standing up, he felt weak. She rested and the symptoms abated one hour. This has happened in the past and has always resolved spontaneously she has never sought medical help.   Past Medical History  Diagnosis Date  . Anemia     Past Surgical History  Procedure Date  . Female circumcision   . No past surgeries     History reviewed. No pertinent family history.  History  Substance Use Topics  . Smoking status: Never Smoker   . Smokeless tobacco: Not on file  . Alcohol Use: No    OB History    Grav Para Term Preterm Abortions TAB SAB Ect Mult Living   5 5 5  0 0 0 0 0 2 6      Review of Systems  Constitutional: Positive for fever and fatigue. Negative for activity change.  HENT: Negative.   Eyes: Negative.   Respiratory: Negative for cough, chest tightness, shortness of breath and wheezing.   Cardiovascular: Positive for palpitations. Negative for chest pain.  Gastrointestinal: Negative.   Genitourinary:       As per history of present illness  Musculoskeletal: Negative.   Skin: Negative for color change, pallor and rash.  Neurological: Negative.     Allergies  Review of patient's allergies indicates no known allergies.  Home Medications   Current Outpatient Rx  Name Route Sig Dispense  Refill  . LEVONORGESTREL 20 MCG/24HR IU IUD Intrauterine 1 each by Intrauterine route once. 1 each 0  . CEPHALEXIN 500 MG PO CAPS Oral Take 1 capsule (500 mg total) by mouth 4 (four) times daily. X 10 days 40 capsule 0  . IRON PO Oral Take 1 tablet by mouth daily.      . OXYCODONE-ACETAMINOPHEN 5-325 MG PO TABS Oral Take 1 tablet by mouth every 4 (four) hours as needed. For pain.     Marland Kitchen PRENATAL PLUS 27-1 MG PO TABS Oral Take 1 tablet by mouth daily.        BP 102/62  Pulse 74  Temp 98.5 F (36.9 C)  Resp 12  SpO2 100%  LMP 06/25/2012  Physical Exam  Constitutional: She is oriented to person, place, and time. She appears well-developed and well-nourished. No distress.  HENT:  Head: Normocephalic and atraumatic.  Eyes: EOM are normal. Pupils are equal, round, and reactive to light.  Neck: Normal range of motion. Neck supple.  Cardiovascular: Normal rate, regular rhythm and normal heart sounds.   Pulmonary/Chest: Effort normal and breath sounds normal. No respiratory distress.  Abdominal: Soft. She exhibits no mass. There is no tenderness. There is no rebound and no guarding.       When asked if there was discomfort when manually pressing over the suprapubic is she stated yes. When percussing the left lower back  she stated there was discomfort.  Musculoskeletal: Normal range of motion.  Neurological: She is alert and oriented to person, place, and time. No cranial nerve deficit.  Skin: Skin is warm and dry.  Psychiatric: She has a normal mood and affect.    ED Course  Procedures (including critical care time)  Labs Reviewed  POCT URINALYSIS DIP (DEVICE) - Abnormal; Notable for the following:    Hgb urine dipstick MODERATE (*)     Protein, ur 100 (*)     Leukocytes, UA LARGE (*)  Biochemical Testing Only. Please order routine urinalysis from main lab if confirmatory testing is needed.   All other components within normal limits  URINE CULTURE   No results found.   1. UTI  (lower urinary tract infection)   2. Tachycardia, unspecified       MDM  Rocephin 1 g IM now Keflex 500 mg 4 times a day for 10 days. If there is any worsening or vomiting and unable to take fluids she is to go to the emergency department. In regards to the reported tachycardia there is no objective evidence. The husband is advised that if this reoccurs they are to call EMS or go to the emergency department promptly. For evaluation. Her heart rate is normal and she has a regular rate and rhythm during the exam.        Hayden Rasmussen, NP 06/25/12 2106  Hayden Rasmussen, NP 06/25/12 2109

## 2012-06-25 NOTE — ED Notes (Signed)
C/o pain with urination onset 3 days ago.  No frequency but has urgency and burning.Also c/o pain in her lower abdomen and back.  Felt like she had a fever last night but did not check it.  Felt like her heart was beating fast @ 1600 and lasted 1 hr.  It has happened before but goes away with rest.

## 2012-06-26 LAB — URINE CULTURE

## 2012-06-26 NOTE — ED Provider Notes (Signed)
Medical screening examination/treatment/procedure(s) were performed by non-physician practitioner and as supervising physician I was immediately available for consultation/collaboration.  Leslee Home, M.D.   Reuben Likes, MD 06/26/12 [phone]

## 2013-01-30 ENCOUNTER — Inpatient Hospital Stay (HOSPITAL_COMMUNITY)
Admission: EM | Admit: 2013-01-30 | Discharge: 2013-02-02 | DRG: 690 | Disposition: A | Discharge status: Home / Self Care | Payer: Medicaid Other | Attending: Internal Medicine | Admitting: Internal Medicine

## 2013-01-30 ENCOUNTER — Encounter (HOSPITAL_COMMUNITY): Payer: Self-pay | Admitting: Emergency Medicine

## 2013-01-30 DIAGNOSIS — D649 Anemia, unspecified: Secondary | ICD-10-CM | POA: Diagnosis present

## 2013-01-30 DIAGNOSIS — R7881 Bacteremia: Secondary | ICD-10-CM

## 2013-01-30 DIAGNOSIS — Z975 Presence of (intrauterine) contraceptive device: Secondary | ICD-10-CM

## 2013-01-30 DIAGNOSIS — N12 Tubulo-interstitial nephritis, not specified as acute or chronic: Secondary | ICD-10-CM | POA: Diagnosis present

## 2013-01-30 DIAGNOSIS — R634 Abnormal weight loss: Secondary | ICD-10-CM

## 2013-01-30 DIAGNOSIS — B9689 Other specified bacterial agents as the cause of diseases classified elsewhere: Secondary | ICD-10-CM | POA: Diagnosis present

## 2013-01-30 DIAGNOSIS — E860 Dehydration: Secondary | ICD-10-CM | POA: Diagnosis present

## 2013-01-30 NOTE — ED Notes (Signed)
PT. PRESENTS WITH MULTIPLE SYMPTOMS REPORTS PAIN AT LOWER BACK , FEVER , MID ABDOMINAL PAIN , DYSURIA AND HEADACHE TODAY . SPOUSE TRANSLATING ( ARABIC ) FOR PT.

## 2013-01-30 NOTE — ED Notes (Addendum)
Translator phone used for RN and MD assessment. Dr. Lavella Lemons at bedside.

## 2013-01-30 NOTE — ED Provider Notes (Signed)
History     CSN: [id number]  Arrival date & time 01/30/13  2319   First MD Initiated Contact with Patient 01/30/13 2323      Chief Complaint  Patient presents with  . Back Pain    (Consider location/radiation/quality/duration/timing/severity/associated sxs/prior treatment) HPI Please note that this is a late entry. This patient was seen by me shortly after her presentation to the emergency department.  The patient is a 32 yo G6 P6 [place] immigrant who is BIB husband with multiple complaints. Hx obtained using phone interpretor as the patient speaks Arabic only.   Patient has bialteral, aching back pain - right > left which is worse with inspiration. She has general malaise and weakness x 3 months but, much worse over the past 2 days. No fever. No cough. No SOB. No chest pain.  She has been nauseated but, has not vomited. No diarrhea. No unusual vaginal discharge.   Pain is aching and moderately severe. Pt endorses dysuria. Denies vaginal bleeding. No abdominal pain. Pain is nonradiating. No history of similar sx.   Of note, ROS is also positive for 6 lb uninential weight loss over the past 3 months, dry skin and loss of hair. Patient has established primary care at [facility]. Has appt on 5/27 but felt too ill to wait until then to be seen.    Past Medical History  Diagnosis Date  . Anemia     Past Surgical History  Procedure Laterality Date  . Female circumcision    . No past surgeries      No family history on file.  History  Substance Use Topics  . Smoking status: Never Smoker   . Smokeless tobacco: Not on file  . Alcohol Use: No    OB History   Grav Para Term Preterm Abortions TAB SAB Ect Mult Living   5 5 5  0 0 0 0 0 2 6      Review of Systems Gen: as per history of present illness, otherwise negative Eyes: no discharge or drainage, no occular pain or visual changes Nose: no epistaxis or rhinorrhea Mouth: no dental pain, no sore throat Neck: no  neck pain Lungs: no SOB, cough, wheezing CV: no chest pain, palpitations, dependent edema or orthopnea Abd: as per history of present illness, otherwise negative GU: no dysuria or gross hematuria MSK: as per history of present illness, otherwise negative Neuro: no headache, no focal neurologic deficits Skin: as per history of present illness, otherwise negative Psyche: depressed since birth of last child 2 years ago.  Allergies  Review of patient's allergies indicates no known allergies.  Home Medications   Current Outpatient Rx  Name  Route  Sig  Dispense  Refill  . cephALEXin (KEFLEX) 500 MG capsule   Oral   Take 1 capsule (500 mg total) by mouth 4 (four) times daily. X 10 days   40 capsule   0   . IRON PO   Oral   Take 1 tablet by mouth daily.           Marland Kitchen EXPIRED: levonorgestrel (MIRENA) 20 MCG/24HR IUD   Intrauterine   1 each by Intrauterine route once.   1 each   0   . oxyCODONE-acetaminophen (PERCOCET) 5-325 MG per tablet   Oral   Take 1 tablet by mouth every 4 (four) hours as needed. For pain.          . prenatal vitamin w/FE, FA (PRENATAL 1 + 1) 27-1 MG TABS  Oral   Take 1 tablet by mouth daily.             BP 126/77  Pulse 116  Temp(Src) 98.5 F (36.9 C) (Oral)  Resp 16  SpO2 100%  LMP 12/08/2012  Physical Exam Gen: well developed and well nourished appearing, ill appearing, diaphoretic, tearful Head: NCAT Eyes: PERL, EOMI Nose: no epistaixis or rhinorrhea Mouth/throat: mucosa is moist and pink Neck: supple, no stridor Lungs: CTA B, no wheezing, rhonchi or rales CV: rapid and regulra, no murmur Abd: soft, notender, nondistended Back: no ttp, right cva ttp Skin: no rashese, wnl Neuro: CN ii-xii grossly intact, no focal deficits Psyche; flat  Affect, tearful  calm and cooperative.   ED Course  Procedures (including critical care time)  Results for orders placed during the hospital encounter of 01/30/13 (from the past 24 hour(s))  CBC  WITH DIFFERENTIAL     Status: Abnormal   Collection Time    01/30/13 11:28 PM      Result Value Range   WBC 9.2  4.0 - 10.5 K/uL   RBC 4.59  3.87 - 5.11 MIL/uL   Hemoglobin 13.7  12.0 - 15.0 g/dL   HCT 40.9  81.1 - 91.4 %   MCV 84.7  78.0 - 100.0 fL   MCH 29.8  26.0 - 34.0 pg   MCHC 35.2  30.0 - 36.0 g/dL   RDW 78.2  95.6 - 21.3 %   Platelets 223  150 - 400 K/uL   Neutrophils Relative % 89 (*) 43 - 77 %   Neutro Abs 8.2 (*) 1.7 - 7.7 K/uL   Lymphocytes Relative 8 (*) 12 - 46 %   Lymphs Abs 0.7  0.7 - 4.0 K/uL   Monocytes Relative 3  3 - 12 %   Monocytes Absolute 0.3  0.1 - 1.0 K/uL   Eosinophils Relative 0  0 - 5 %   Eosinophils Absolute 0.0  0.0 - 0.7 K/uL   Basophils Relative 0  0 - 1 %   Basophils Absolute 0.0  0.0 - 0.1 K/uL  COMPREHENSIVE METABOLIC PANEL     Status: Abnormal   Collection Time    01/30/13 11:28 PM      Result Value Range   Sodium 136  135 - 145 mEq/L   Potassium 3.5  3.5 - 5.1 mEq/L   Chloride 100  96 - 112 mEq/L   CO2 23  19 - 32 mEq/L   Glucose, Bld 103 (*) 70 - 99 mg/dL   BUN 13  6 - 23 mg/dL   Creatinine, Ser 0.86  0.50 - 1.10 mg/dL   Calcium 9.0  8.4 - 57.8 mg/dL   Total Protein 7.7  6.0 - 8.3 g/dL   Albumin 3.8  3.5 - 5.2 g/dL   AST 23  0 - 37 U/L   ALT 16  0 - 35 U/L   Alkaline Phosphatase 68  39 - 117 U/L   Total Bilirubin 0.5  0.3 - 1.2 mg/dL   GFR calc non Af Amer >90  >90 mL/min   GFR calc Af Amer >90  >90 mL/min  MAGNESIUM     Status: None   Collection Time    01/30/13 11:28 PM      Result Value Range   Magnesium 1.8  1.5 - 2.5 mg/dL  URINALYSIS, ROUTINE W REFLEX MICROSCOPIC     Status: Abnormal   Collection Time    01/31/13 12:20 AM  Result Value Range   Color, Urine YELLOW  YELLOW   APPearance CLOUDY (*) CLEAR   Specific Gravity, Urine 1.019  1.005 - 1.030   pH 5.5  5.0 - 8.0   Glucose, UA NEGATIVE  NEGATIVE mg/dL   Hgb urine dipstick MODERATE (*) NEGATIVE   Bilirubin Urine NEGATIVE  NEGATIVE   Ketones, ur NEGATIVE   NEGATIVE mg/dL   Protein, ur NEGATIVE  NEGATIVE mg/dL   Urobilinogen, UA 0.2  0.0 - 1.0 mg/dL   Nitrite NEGATIVE  NEGATIVE   Leukocytes, UA MODERATE (*) NEGATIVE  URINE MICROSCOPIC-ADD ON     Status: Abnormal   Collection Time    01/31/13 12:20 AM      Result Value Range   Squamous Epithelial / LPF RARE  RARE   WBC, UA 21-50  <3 WBC/hpf   RBC / HPF 11-20  <3 RBC/hpf   Bacteria, UA FEW (*) RARE  POCT PREGNANCY, URINE     Status: None   Collection Time    01/31/13  1:33 AM      Result Value Range   Preg Test, Ur NEGATIVE  NEGATIVE  LACTIC ACID, PLASMA     Status: None   Collection Time    01/31/13  2:02 AM      Result Value Range   Lactic Acid, Venous 1.1  0.5 - 2.2 mmol/L      MDM  Patient with UTI, sepsis, pyelonephritis. Cultures sent. We are resuscitating with crystalloid. Lactic acid and MS are wnl. Pt tx empirically with Ceftriaxone. Case discussed with Dr. Nedra Hai - I have requested admission to Tele Unit.   CRITICAL CARE Performed by: Brandt Loosen   Total critical care time: 24M  Critical care time was exclusive of separately billable procedures and treating other patients.  Critical care was necessary to treat or prevent imminent or life-threatening deterioration.  Critical care was time spent personally by me on the following activities: development of treatment plan with patient and/or surrogate as well as nursing, discussions with consultants, evaluation of patient's response to treatment, examination of patient, obtaining history from patient or surrogate, ordering and performing treatments and interventions, ordering and review of laboratory studies, ordering and review of radiographic studies, pulse oximetry and re-evaluation of patient's condition.       Brandt Loosen, MD 01/31/13 [phone]

## 2013-01-31 ENCOUNTER — Encounter (HOSPITAL_COMMUNITY): Payer: Self-pay | Admitting: Radiology

## 2013-01-31 ENCOUNTER — Emergency Department (HOSPITAL_COMMUNITY): Payer: Medicaid Other

## 2013-01-31 ENCOUNTER — Inpatient Hospital Stay (HOSPITAL_COMMUNITY): Payer: Medicaid Other

## 2013-01-31 DIAGNOSIS — N12 Tubulo-interstitial nephritis, not specified as acute or chronic: Secondary | ICD-10-CM | POA: Diagnosis present

## 2013-01-31 DIAGNOSIS — Z975 Presence of (intrauterine) contraceptive device: Secondary | ICD-10-CM

## 2013-01-31 LAB — URINE MICROSCOPIC-ADD ON

## 2013-01-31 LAB — CBC WITH DIFFERENTIAL/PLATELET
Eosinophils Absolute: 0 10*3/uL (ref 0.0–0.7)
HCT: 38.9 % (ref 36.0–46.0)
Hemoglobin: 13.7 g/dL (ref 12.0–15.0)
Lymphs Abs: 0.7 10*3/uL (ref 0.7–4.0)
MCH: 29.8 pg (ref 26.0–34.0)
Monocytes Relative: 3 % (ref 3–12)
Neutrophils Relative %: 89 % — ABNORMAL HIGH (ref 43–77)
RBC: 4.59 MIL/uL (ref 3.87–5.11)

## 2013-01-31 LAB — URINALYSIS, ROUTINE W REFLEX MICROSCOPIC
Ketones, ur: NEGATIVE mg/dL
Nitrite: NEGATIVE
Protein, ur: NEGATIVE mg/dL
Urobilinogen, UA: 0.2 mg/dL (ref 0.0–1.0)

## 2013-01-31 LAB — COMPREHENSIVE METABOLIC PANEL
Alkaline Phosphatase: 68 U/L (ref 39–117)
BUN: 13 mg/dL (ref 6–23)
Chloride: 100 mEq/L (ref 96–112)
GFR calc Af Amer: >90 mL/min (ref >90)
Glucose, Bld: 103 mg/dL — ABNORMAL HIGH (ref 70–99)
Potassium: 3.5 mEq/L (ref 3.5–5.1)
Total Bilirubin: 0.5 mg/dL (ref 0.3–1.2)
Total Protein: 7.7 g/dL (ref 6.0–8.3)

## 2013-01-31 LAB — LACTIC ACID, PLASMA: Lactic Acid, Venous: 1.1 mmol/L (ref 0.5–2.2)

## 2013-01-31 MED ORDER — ENOXAPARIN SODIUM 40 MG/0.4ML ~~LOC~~ SOLN
40.0000 mg | SUBCUTANEOUS | Status: DC
  Administered 2013-01-31 – 2013-02-02 (×3): 40 mg via SUBCUTANEOUS
  Filled 2013-01-31 (×4): qty 0.4

## 2013-01-31 MED ORDER — DEXTROSE 5 % IV SOLN
500.0000 mg | Freq: Once | INTRAVENOUS | Status: AC
  Administered 2013-01-31: 500 mg via INTRAVENOUS
  Filled 2013-01-31: qty 500

## 2013-01-31 MED ORDER — DEXTROSE 5 % IV SOLN
1.0000 g | Freq: Once | INTRAVENOUS | Status: AC
  Administered 2013-01-31: 1 g via INTRAVENOUS
  Filled 2013-01-31: qty 10

## 2013-01-31 MED ORDER — PIPERACILLIN-TAZOBACTAM 3.375 G IVPB
3.3750 g | Freq: Three times a day (TID) | INTRAVENOUS | Status: DC
  Administered 2013-01-31: 3.375 g via INTRAVENOUS
  Filled 2013-01-31 (×3): qty 50

## 2013-01-31 MED ORDER — HYDROMORPHONE HCL PF 1 MG/ML IJ SOLN: 1.0000 mg | INTRAMUSCULAR | Status: DC | PRN

## 2013-01-31 MED ORDER — SODIUM CHLORIDE 0.9 % IV BOLUS (SEPSIS)
1000.0000 mL | Freq: Once | INTRAVENOUS | Status: AC
  Administered 2013-01-31: 1000 mL via INTRAVENOUS

## 2013-01-31 MED ORDER — SODIUM CHLORIDE 0.9 % IJ SOLN
3.0000 mL | Freq: Two times a day (BID) | INTRAMUSCULAR | Status: DC
  Administered 2013-01-31 – 2013-02-01 (×2): 3 mL via INTRAVENOUS

## 2013-01-31 MED ORDER — ONDANSETRON HCL 4 MG/2ML IJ SOLN: 4.0000 mg | Freq: Four times a day (QID) | INTRAMUSCULAR | Status: DC | PRN

## 2013-01-31 MED ORDER — LEVOFLOXACIN IN D5W 750 MG/150ML IV SOLN
750.0000 mg | INTRAVENOUS | Status: DC
  Administered 2013-01-31: 750 mg via INTRAVENOUS
  Filled 2013-01-31: qty 150

## 2013-01-31 MED ORDER — SODIUM CHLORIDE 0.9 % IV SOLN
INTRAVENOUS | Status: DC
  Administered 2013-01-31 – 2013-02-01 (×3): via INTRAVENOUS

## 2013-01-31 MED ORDER — IOHEXOL 350 MG/ML SOLN
100.0000 mL | Freq: Once | INTRAVENOUS | Status: AC | PRN
  Administered 2013-01-31: 80 mL via INTRAVENOUS

## 2013-01-31 MED ORDER — PIPERACILLIN-TAZOBACTAM 3.375 G IVPB 30 MIN
3.3750 g | Freq: Three times a day (TID) | INTRAVENOUS | Status: DC
  Filled 2013-01-31 (×2): qty 50

## 2013-01-31 MED ORDER — DEXTROSE 5 % IV SOLN
1.0000 g | INTRAVENOUS | Status: DC
  Administered 2013-01-31 – 2013-02-01 (×2): 1 g via INTRAVENOUS
  Filled 2013-01-31 (×3): qty 10

## 2013-01-31 MED ORDER — HYDROCODONE-ACETAMINOPHEN 5-325 MG PO TABS: 1.0000 | ORAL_TABLET | ORAL | Status: DC | PRN

## 2013-01-31 MED ORDER — ACETAMINOPHEN 325 MG PO TABS
650.0000 mg | ORAL_TABLET | Freq: Four times a day (QID) | ORAL | Status: DC | PRN
  Administered 2013-01-31 – 2013-02-01 (×3): 650 mg via ORAL
  Filled 2013-01-31 (×3): qty 2

## 2013-01-31 MED ORDER — ONDANSETRON HCL 4 MG/2ML IJ SOLN
INTRAMUSCULAR | Status: AC
  Administered 2013-01-31: 4 mg via INTRAVENOUS
  Filled 2013-01-31: qty 2

## 2013-01-31 NOTE — H&P (Signed)
[facility] History and Physical  Karen Berry ZOX:[id number] DOB: Jul 22, 1981    PCP:  None  Chief Complaint: fever, malaise, low back pain.  HPI: I have used the interpreter's service to obtain history. Karen Berry is an 32 y.o. female Sudainese with benign PMH including anemia, s/p IUD, presents to the ER with 5 days of general malaise, nausea, dyuria, urgency and frequency.  She has bilateral low back pain with left flank pain.  She denied vaginal discharge, dyspareunia, abdominal pain, chest pain, cough, or shortness of breath.  Work up in the ER included no leukocytosis, normal HB, kidney fx tests, and LFTs.  Her UA is positive for TNTCs WBC and rare bact. Her CXR was clear and a CTPA showed no PE. Her pregnancy test is negative.  Hospitalist was asked to admit her for possible PNA and IV Rocephin and Zithromax was given.  Other than tachycardia to 110 when febrile (up to 103F) , she maintained her hemodynamic stability in the ER.  Rewiew of Systems:  Constitutional: Negative for malaise, fever and chills. No significant weight loss or weight gain Eyes: Negative for eye pain, redness and discharge, diplopia, visual changes, or flashes of light. ENMT: Negative for ear pain, hoarseness, nasal congestion, sinus pressure and sore throat. No headaches; tinnitus, drooling, or problem swallowing. Cardiovascular: Negative for chest pain, palpitations, diaphoresis, dyspnea and peripheral edema. ; No orthopnea, PND Respiratory: Negative for cough, hemoptysis, wheezing and stridor. No pleuritic chestpain. Gastrointestinal: Negative for nausea, vomiting, diarrhea, constipation, abdominal pain, melena, blood in stool, hematemesis, jaundice and rectal bleeding.    Genitourinary: see above. Musculoskeletal: Negative for back pain and neck pain. Negative for swelling and trauma.;  Skin: . Negative for pruritus, rash, abrasions, bruising and skin lesion.; ulcerations Neuro: Negative for headache,  lightheadedness and neck stiffness. Negative for weakness, altered level of consciousness , altered mental status, extremity weakness, burning feet, involuntary movement, seizure and syncope.  Psych: negative for anxiety, depression, insomnia, tearfulness, panic attacks, hallucinations, paranoia, suicidal or homicidal ideation   Past Medical History  Diagnosis Date  . Anemia     Past Surgical History  Procedure Laterality Date  . Female circumcision    . No past surgeries      Medications:  HOME MEDS: Prior to Admission medications   Medication Sig Start Date End Date Taking? Authorizing Provider  acetaminophen (TYLENOL) 325 MG tablet Take 650 mg by mouth every 6 (six) hours as needed for pain.   Yes Historical Provider, MD     Allergies:  No Known Allergies  Social History:   reports that she has never smoked. She does not have any smokeless tobacco history on file. She reports that she does not drink alcohol or use illicit drugs.  Family History: No family history on file.   Physical Exam: Filed Vitals:   01/31/13 0215 01/31/13 0230 01/31/13 0245 01/31/13 0330  BP: 105/58 101/57 95/58 103/60  Pulse: 122 117 113 105  Temp:      TempSrc:      Resp: 26 25 23 21   SpO2: 100% 100% 100% 100%   Blood pressure 103/60, pulse 105, temperature 103.1 F (39.5 C), temperature source Oral, resp. rate 21, last menstrual period 12/08/2012, SpO2 100.00%.  GEN:  Pleasant patient lying in the stretcher in no acute distress; cooperative with exam. PSYCH:  alert and oriented x4; does not appear anxious or depressed; affect is appropriate. HEENT: Mucous membranes pink and anicteric; PERRLA; EOM intact; no cervical lymphadenopathy nor thyromegaly or  carotid bruit; no JVD; There were no stridor. Neck is very supple. Breasts:: Not examined CHEST WALL: No tenderness CHEST: Normal respiration, clear to auscultation bilaterally.  HEART: Regular rate and rhythm.  There are no murmur, rub, or  gallops.   BACK: No kyphosis or scoliosis; tenderness over her left flank. ABDOMEN: soft and non-tender; no masses, no organomegaly, normal abdominal bowel sounds; no pannus; no intertriginous candida. There is no rebound and no distention. Rectal Exam: Not done EXTREMITIES: No bone or joint deformity; age-appropriate arthropathy of the hands and knees; no edema; no ulcerations.  There is no calf tenderness. Genitalia: not examined PULSES: 2+ and symmetric SKIN: Normal hydration no rash or ulceration CNS: Cranial nerves 2-12 grossly intact no focal lateralizing neurologic deficit.  Speech is fluent; uvula elevated with phonation, facial symmetry and tongue midline. DTR are normal bilaterally, cerebella exam is intact, barbinski is negative and strengths are equaled bilaterally.  No sensory loss.   Labs on Admission:  Basic Metabolic Panel:  Recent Labs Lab 01/30/13 2328  NA 136  K 3.5  CL 100  CO2 23  GLUCOSE 103*  BUN 13  CREATININE 0.58  CALCIUM 9.0  MG 1.8   Liver Function Tests:  Recent Labs Lab 01/30/13 2328  AST 23  ALT 16  ALKPHOS 68  BILITOT 0.5  PROT 7.7  ALBUMIN 3.8   No results found for this basename: LIPASE, AMYLASE,  in the last 168 hours No results found for this basename: AMMONIA,  in the last 168 hours CBC:  Recent Labs Lab 01/30/13 2328  WBC 9.2  NEUTROABS 8.2*  HGB 13.7  HCT 38.9  MCV 84.7  PLT 223   Cardiac Enzymes: No results found for this basename: CKTOTAL, CKMB, CKMBINDEX, TROPONINI,  in the last 168 hours  CBG: No results found for this basename: GLUCAP,  in the last 168 hours   Radiological Exams on Admission: Dg Chest 2 View  01/31/2013   **ADDENDUM** CREATED: 01/31/2013 02:06:11  Given the patient's symptoms, minimal nodularity at the right lung base could reflect mild infiltrate.  Alternatively, it could reflect normal vasculature.  **END ADDENDUM** SIGNED BY: Tonia Ghent, M.D.  01/31/2013   *RADIOLOGY REPORT*  Clinical  Data: Chest pain, chills, weakness and dizziness; upper back pain.  CHEST - 2 VIEW  Comparison: None.  Findings: The lungs are well-aerated and clear.  There is no evidence of focal opacification, pleural effusion or pneumothorax.  The heart is normal in size; the mediastinal contour is within normal limits.  No acute osseous abnormalities are seen.  IMPRESSION: No acute cardiopulmonary process seen.   Original Report Authenticated By: Tonia Ghent, M.D.   Ct Angio Chest Pe W/cm &/or Wo Cm  01/31/2013   *RADIOLOGY REPORT*  Clinical Data: Tachycardia and tachypnea; fever.  CT ANGIOGRAPHY CHEST  Technique:  Multidetector CT imaging of the chest using the standard protocol during bolus administration of intravenous contrast. Multiplanar reconstructed images including MIPs were obtained and reviewed to evaluate the vascular anatomy.  Contrast: 80mL OMNIPAQUE IOHEXOL 350 MG/ML SOLN  Comparison: Chest radiograph performed earlier today at 01:26 a.m.  Findings: There is no evidence of pulmonary embolus.  Mild bibasilar atelectasis is noted.  The lungs are otherwise clear.  There is no evidence of significant focal consolidation, pleural effusion or pneumothorax.  No masses are identified; no abnormal focal contrast enhancement is seen.  The mediastinum is unremarkable in appearance.  No mediastinal lymphadenopathy is seen.  No pericardial effusion is identified. The  great vessels are grossly unremarkable in appearance. Incidental note is made of a direct origin of the left vertebral artery from the aortic arch.  No axillary lymphadenopathy is seen. The visualized portions of the thyroid gland are unremarkable in appearance.  The visualized portions of the liver and spleen are unremarkable. The visualized portions of the pancreas, stomach, adrenal glands and kidneys are within normal limits.  No acute osseous abnormalities are seen.  IMPRESSION:  1.  No evidence of pulmonary embolus. 2.  Mild bibasilar atelectasis  noted; lungs otherwise clear.   Original Report Authenticated By: Tonia Ghent, M.D.    Assessment/Plan Present on Admission:  . Pyelonephritis Dehydration  PLAN:  I suspect she has pyelonephritis rather than any pulmonary infection.  I will change her ab to Levoquin and IV Zosyn to cover potential enterococcus UTI.  She will be given IVF and antipyretics along with antiemetics.  She is stable and will be admitted to [facility] service under telemetry.  Blood and urine culture was done.  Other plans as per orders.  Code Status: FULL Unk Lightning, MD. [facility] Pager [phone] 7pm to 7am.  01/31/2013, 3:46 AM

## 2013-01-31 NOTE — ED Notes (Addendum)
Dr. Lavella Lemons made aware of high temperature and tachycardia. Pt c/o feeling cold and is diaphoretic. EDP Declines to call Code sepsis at this time.

## 2013-01-31 NOTE — ED Notes (Signed)
Pt returned from X-ray.  

## 2013-01-31 NOTE — ED Notes (Signed)
Dr. Nedra Hai at bedside to admit

## 2013-01-31 NOTE — ED Notes (Signed)
Phlebotomy notified of need for blood cultures.  

## 2013-01-31 NOTE — Progress Notes (Signed)
[facility] lab called to report 1 bottle of blood cultures growing gram negative rods. Elvera Lennox Riedler provider on call text paged to notify awaiting return call. Julien Nordmann [facility]

## 2013-01-31 NOTE — Progress Notes (Signed)
PATIENT DETAILS Name: Karen Berry Age: 32 y.o. Sex: female Date of Birth: [birth year] Admit Date: 01/30/2013 Admitting Physician Houston Siren, MD PCP:No primary provider on file.  Subjective: Feels slightly better, less back pain  Assessment/Plan: Principal Problem:   Pyelonephritis -Febrile overnight, but not toxic looking -Urine and Blood cultures on 5/25-pending -do not think she needs both levaquin and Zosyn-she is apparently breastfeeding-I asked her not to for now. But will just change antibiotics to IV Rocephin and observe -abd ultrasound done-pending results  Disposition: Remain inpatient  DVT Prophylaxis: Prophylactic Lovenox  Code Status: Full code   Procedures:  None  CONSULTS:  None   MEDICATIONS: Scheduled Meds: . enoxaparin (LOVENOX) injection  40 mg Subcutaneous Q24H  . levofloxacin (LEVAQUIN) IV  750 mg Intravenous Q24H  . piperacillin-tazobactam (ZOSYN)  IV  3.375 g Intravenous Q8H  . sodium chloride  3 mL Intravenous Q12H   Continuous Infusions: . sodium chloride 150 mL/hr at 01/31/13 0516   PRN Meds:.acetaminophen, HYDROcodone-acetaminophen, HYDROmorphone (DILAUDID) injection, ondansetron (ZOFRAN) IV  Antibiotics: Anti-infectives   Start     Dose/Rate Route Frequency Ordered Stop   01/31/13 0600  piperacillin-tazobactam (ZOSYN) IVPB 3.375 g  Status:  Discontinued     3.375 g 100 mL/hr over 30 Minutes Intravenous 3 times per day 01/31/13 0459 01/31/13 0500   01/31/13 0600  piperacillin-tazobactam (ZOSYN) IVPB 3.375 g     3.375 g 12.5 mL/hr over 240 Minutes Intravenous 3 times per day 01/31/13 0501     01/31/13 0500  levofloxacin (LEVAQUIN) IVPB 750 mg     750 mg 100 mL/hr over 90 Minutes Intravenous Every 24 hours 01/31/13 0459     01/31/13 0215  cefTRIAXone (ROCEPHIN) 1 g in dextrose 5 % 50 mL IVPB     1 g 100 mL/hr over 30 Minutes Intravenous  Once 01/31/13 0201 01/31/13 0436   01/31/13 0215  azithromycin (ZITHROMAX) 500 mg in dextrose  5 % 250 mL IVPB     500 mg 250 mL/hr over 60 Minutes Intravenous  Once 01/31/13 0201 01/31/13 0436       PHYSICAL EXAM: Vital signs in last 24 hours: Filed Vitals:   01/31/13 0330 01/31/13 0430 01/31/13 0503 01/31/13 0510  BP: 103/60 96/59 93/61    Pulse: 105 90 99   Temp:   99.2 F (37.3 C)   TempSrc:   Oral   Resp: 21 19 16    Height:    5\' 4"  (1.626 m)  Weight:    55.157 kg (121 lb 9.6 oz)  SpO2: 100% 100% 98%     Weight change:  Filed Weights   01/31/13 0510  Weight: 55.157 kg (121 lb 9.6 oz)   Body mass index is 20.86 kg/(m^2).   Gen Exam: Awake and alert with clear speech.   Neck: Supple, No JVD.   Chest: B/L Clear.   CVS: S1 S2 Regular, no murmurs.  Abdomen: soft, BS +, non tender, non distended. B/L CVA tenderness Extremities: no edema, lower extremities warm to touch. Neurologic: Non Focal.   Skin: No Rash.   Wounds: N/A.    Intake/Output from previous day: No intake or output data in the 24 hours ending 01/31/13 1219   LAB RESULTS: CBC  Recent Labs Lab 01/30/13 2328  WBC 9.2  HGB 13.7  HCT 38.9  PLT 223  MCV 84.7  MCH 29.8  MCHC 35.2  RDW 12.7  LYMPHSABS 0.7  MONOABS 0.3  EOSABS 0.0  BASOSABS 0.0    Chemistries   Recent  Labs Lab 01/30/13 2328  NA 136  K 3.5  CL 100  CO2 23  GLUCOSE 103*  BUN 13  CREATININE 0.58  CALCIUM 9.0  MG 1.8    CBG: No results found for this basename: GLUCAP,  in the last 168 hours  GFR Estimated Creatinine Clearance: 87.2 ml/min (by C-G formula based on Cr of 0.58).  Coagulation profile No results found for this basename: INR, PROTIME,  in the last 168 hours  Cardiac Enzymes No results found for this basename: CK, CKMB, TROPONINI, MYOGLOBIN,  in the last 168 hours  No components found with this basename: POCBNP,  No results found for this basename: DDIMER,  in the last 72 hours No results found for this basename: HGBA1C,  in the last 72 hours No results found for this basename: CHOL, HDL,  LDLCALC, TRIG, CHOLHDL, LDLDIRECT,  in the last 72 hours No results found for this basename: TSH, T4TOTAL, FREET3, T3FREE, THYROIDAB,  in the last 72 hours No results found for this basename: VITAMINB12, FOLATE, FERRITIN, TIBC, IRON, RETICCTPCT,  in the last 72 hours No results found for this basename: LIPASE, AMYLASE,  in the last 72 hours  Urine Studies No results found for this basename: UACOL, UAPR, USPG, UPH, UTP, UGL, UKET, UBIL, UHGB, UNIT, UROB, ULEU, UEPI, UWBC, URBC, UBAC, CAST, CRYS, UCOM, BILUA,  in the last 72 hours  MICROBIOLOGY: No results found for this or any previous visit (from the past 240 hour(s)).  RADIOLOGY STUDIES/RESULTS: Dg Chest 2 View  01/31/2013   **ADDENDUM** CREATED: 01/31/2013 02:06:11  Given the patient's symptoms, minimal nodularity at the right lung base could reflect mild infiltrate.  Alternatively, it could reflect normal vasculature.  **END ADDENDUM** SIGNED BY: Tonia Ghent, M.D.  01/31/2013   *RADIOLOGY REPORT*  Clinical Data: Chest pain, chills, weakness and dizziness; upper back pain.  CHEST - 2 VIEW  Comparison: None.  Findings: The lungs are well-aerated and clear.  There is no evidence of focal opacification, pleural effusion or pneumothorax.  The heart is normal in size; the mediastinal contour is within normal limits.  No acute osseous abnormalities are seen.  IMPRESSION: No acute cardiopulmonary process seen.   Original Report Authenticated By: Tonia Ghent, M.D.   Ct Angio Chest Pe W/cm &/or Wo Cm  01/31/2013   *RADIOLOGY REPORT*  Clinical Data: Tachycardia and tachypnea; fever.  CT ANGIOGRAPHY CHEST  Technique:  Multidetector CT imaging of the chest using the standard protocol during bolus administration of intravenous contrast. Multiplanar reconstructed images including MIPs were obtained and reviewed to evaluate the vascular anatomy.  Contrast: 80mL OMNIPAQUE IOHEXOL 350 MG/ML SOLN  Comparison: Chest radiograph performed earlier today at 01:26  a.m.  Findings: There is no evidence of pulmonary embolus.  Mild bibasilar atelectasis is noted.  The lungs are otherwise clear.  There is no evidence of significant focal consolidation, pleural effusion or pneumothorax.  No masses are identified; no abnormal focal contrast enhancement is seen.  The mediastinum is unremarkable in appearance.  No mediastinal lymphadenopathy is seen.  No pericardial effusion is identified. The great vessels are grossly unremarkable in appearance. Incidental note is made of a direct origin of the left vertebral artery from the aortic arch.  No axillary lymphadenopathy is seen. The visualized portions of the thyroid gland are unremarkable in appearance.  The visualized portions of the liver and spleen are unremarkable. The visualized portions of the pancreas, stomach, adrenal glands and kidneys are within normal limits.  No acute osseous abnormalities  are seen.  IMPRESSION:  1.  No evidence of pulmonary embolus. 2.  Mild bibasilar atelectasis noted; lungs otherwise clear.   Original Report Authenticated By: Tonia Ghent, M.D.    Jeoffrey Massed, MD  [facility] Pager:[phone]  If 7PM-7AM, please contact night-coverage [email or web address] Password [email or web address] 01/31/2013, 12:19 PM   LOS: 1 day

## 2013-01-31 NOTE — Progress Notes (Signed)
Pt admitted to the unit with husband. Translator called. Pt is stable, alert and oriented per baseline. Oriented to room, staff, and call bell. Educated to call for any assistance. Bed in lowest position, call bell within reach- will continue to monitor.

## 2013-01-31 NOTE — Progress Notes (Signed)
Patient is refusing to wear his telemetry. Removed box and notified CMD.

## 2013-02-01 LAB — BASIC METABOLIC PANEL
BUN: 6 mg/dL (ref 6–23)
Calcium: 8.2 mg/dL — ABNORMAL LOW (ref 8.4–10.5)
Creatinine, Ser: 0.52 mg/dL (ref 0.50–1.10)
GFR calc Af Amer: >90 mL/min (ref >90)
GFR calc non Af Amer: >90 mL/min (ref >90)
Glucose, Bld: 104 mg/dL — ABNORMAL HIGH (ref 70–99)

## 2013-02-01 LAB — CBC
HCT: 37.2 % (ref 36.0–46.0)
Hemoglobin: 12.4 g/dL (ref 12.0–15.0)
MCH: 28.9 pg (ref 26.0–34.0)
MCHC: 33.3 g/dL (ref 30.0–36.0)
RDW: 13.1 % (ref 11.5–15.5)

## 2013-02-01 MED ORDER — ENSURE COMPLETE PO LIQD
237.0000 mL | Freq: Two times a day (BID) | ORAL | Status: DC
  Administered 2013-02-01: 237 mL via ORAL

## 2013-02-01 NOTE — Progress Notes (Signed)
Utilization review completed.  P.J. Tanae Petrosky,RN,BSN Case Manager [phone]  

## 2013-02-01 NOTE — Progress Notes (Addendum)
PATIENT DETAILS Name: Karen Berry Age: 32 y.o. Sex: female Date of Birth: [birth year] Admit Date: 01/30/2013 Admitting Physician Houston Siren, MD PCP:No primary provider on file.  Subjective: Much better-no fever overnight-less CVA tenderness  Assessment/Plan: Principal Problem:   Pyelonephritis with Gram neg Bacteremia -admitted with Fever and B/L flank/back pain. UA/Back pain suggestive of Pyelonephritis. Initially on Zosyn.Levaquin-but narrowed on 5/25 to IV Rocephin.  -Afebrile overnight,not toxic looking, clinically much better -Urine cx 5/25-pending -Blood cx 5/25-gram neg roda -c/w Rocephin -abd ultrasound 5/25-left kidney show a heterogeneous echotexture which could go along with a clinical suspicion of  Pyelonephritis.No evidence of hydronephrosis or abscess  Disposition: Remain inpatient-home 5/27  DVT Prophylaxis: Prophylactic Lovenox  Code Status: Full code   Procedures:  None  CONSULTS:  None   MEDICATIONS: Scheduled Meds: . cefTRIAXone (ROCEPHIN)  IV  1 g Intravenous Q24H  . enoxaparin (LOVENOX) injection  40 mg Subcutaneous Q24H  . sodium chloride  3 mL Intravenous Q12H   Continuous Infusions: . sodium chloride 100 mL/hr at 02/01/13 0414   PRN Meds:.acetaminophen, HYDROcodone-acetaminophen, HYDROmorphone (DILAUDID) injection, ondansetron (ZOFRAN) IV  Antibiotics: Anti-infectives   Start     Dose/Rate Route Frequency Ordered Stop   01/31/13 1400  cefTRIAXone (ROCEPHIN) 1 g in dextrose 5 % 50 mL IVPB     1 g 100 mL/hr over 30 Minutes Intravenous Every 24 hours 01/31/13 1221     01/31/13 0600  piperacillin-tazobactam (ZOSYN) IVPB 3.375 g  Status:  Discontinued     3.375 g 100 mL/hr over 30 Minutes Intravenous 3 times per day 01/31/13 0459 01/31/13 0500   01/31/13 0600  piperacillin-tazobactam (ZOSYN) IVPB 3.375 g  Status:  Discontinued     3.375 g 12.5 mL/hr over 240 Minutes Intravenous 3 times per day 01/31/13 0501 01/31/13 1221   01/31/13 0500   levofloxacin (LEVAQUIN) IVPB 750 mg  Status:  Discontinued     750 mg 100 mL/hr over 90 Minutes Intravenous Every 24 hours 01/31/13 0459 01/31/13 1221   01/31/13 0215  cefTRIAXone (ROCEPHIN) 1 g in dextrose 5 % 50 mL IVPB     1 g 100 mL/hr over 30 Minutes Intravenous  Once 01/31/13 0201 01/31/13 0436   01/31/13 0215  azithromycin (ZITHROMAX) 500 mg in dextrose 5 % 250 mL IVPB     500 mg 250 mL/hr over 60 Minutes Intravenous  Once 01/31/13 0201 01/31/13 0436       PHYSICAL EXAM: Vital signs in last 24 hours: Filed Vitals:   01/31/13 0510 01/31/13 1628 01/31/13 2155 02/01/13 0441  BP:  90/56 98/65 100/65  Pulse:  76 74 74  Temp:  97.7 F (36.5 C) 98.4 F (36.9 C) 98.7 F (37.1 C)  TempSrc:  Oral Oral Oral  Resp:  18 16 16   Height: 5\' 4"  (1.626 m)     Weight: 55.157 kg (121 lb 9.6 oz)     SpO2:  97% 96% 99%    Weight change:  Filed Weights   01/31/13 0510  Weight: 55.157 kg (121 lb 9.6 oz)   Body mass index is 20.86 kg/(m^2).   Gen Exam: Awake and alert with clear speech.   Neck: Supple, No JVD.   Chest: B/L Clear.   CVS: S1 S2 Regular, no murmurs.  Abdomen: soft, BS +, non tender, non distended.Minimal B/L CVA tenderness Extremities: no edema, lower extremities warm to touch. Neurologic: Non Focal.   Skin: No Rash.   Wounds: N/A.    Intake/Output from previous day:  Intake/Output Summary (  Last 24 hours) at 02/01/13 0900 Last data filed at 02/01/13 2956  Gross per 24 hour  Intake 961.67 ml  Output      0 ml  Net 961.67 ml     LAB RESULTS: CBC  Recent Labs Lab 01/30/13 2328 02/01/13 0538  WBC 9.2 6.1  HGB 13.7 12.4  HCT 38.9 37.2  PLT 223 202  MCV 84.7 86.7  MCH 29.8 28.9  MCHC 35.2 33.3  RDW 12.7 13.1  LYMPHSABS 0.7  --   MONOABS 0.3  --   EOSABS 0.0  --   BASOSABS 0.0  --     Chemistries   Recent Labs Lab 01/30/13 2328 02/01/13 0538  NA 136 137  K 3.5 3.5  CL 100 106  CO2 23 24  GLUCOSE 103* 104*  BUN 13 6  CREATININE 0.58 0.52   CALCIUM 9.0 8.2*  MG 1.8  --     CBG: No results found for this basename: GLUCAP,  in the last 168 hours  GFR Estimated Creatinine Clearance: 87.2 ml/min (by C-G formula based on Cr of 0.52).  Coagulation profile No results found for this basename: INR, PROTIME,  in the last 168 hours  Cardiac Enzymes No results found for this basename: CK, CKMB, TROPONINI, MYOGLOBIN,  in the last 168 hours  No components found with this basename: POCBNP,  No results found for this basename: DDIMER,  in the last 72 hours No results found for this basename: HGBA1C,  in the last 72 hours No results found for this basename: CHOL, HDL, LDLCALC, TRIG, CHOLHDL, LDLDIRECT,  in the last 72 hours  Recent Labs  01/31/13 0011  TSH 3.195   No results found for this basename: VITAMINB12, FOLATE, FERRITIN, TIBC, IRON, RETICCTPCT,  in the last 72 hours No results found for this basename: LIPASE, AMYLASE,  in the last 72 hours  Urine Studies No results found for this basename: UACOL, UAPR, USPG, UPH, UTP, UGL, UKET, UBIL, UHGB, UNIT, UROB, ULEU, UEPI, UWBC, URBC, UBAC, CAST, CRYS, UCOM, BILUA,  in the last 72 hours  MICROBIOLOGY: Recent Results (from the past 240 hour(s))  CULTURE, BLOOD (ROUTINE X 2)     Status: None   Collection Time    01/31/13  2:25 AM      Result Value Range Status   Specimen Description BLOOD RIGHT ARM   Final   Special Requests     Final   Value: BOTTLES DRAWN AEROBIC AND ANAEROBIC 10CC BLUE 7CC RED   Culture  Setup Time 01/31/2013 12:42   Final   Culture     Final   Value: GRAM NEGATIVE RODS     25 Note: Gram Stain Report Called to,Read Back By and Verified With: MARIE TROGDEN  5 2014  2233PM   Report Status PENDING   Incomplete    RADIOLOGY STUDIES/RESULTS: Dg Chest 2 View  01/31/2013   **ADDENDUM** CREATED: 01/31/2013 02:06:11  Given the patient's symptoms, minimal nodularity at the right lung base could reflect mild infiltrate.  Alternatively, it could reflect normal  vasculature.  **END ADDENDUM** SIGNED BY: Tonia Ghent, M.D.  01/31/2013   *RADIOLOGY REPORT*  Clinical Data: Chest pain, chills, weakness and dizziness; upper back pain.  CHEST - 2 VIEW  Comparison: None.  Findings: The lungs are well-aerated and clear.  There is no evidence of focal opacification, pleural effusion or pneumothorax.  The heart is normal in size; the mediastinal contour is within normal limits.  No acute osseous abnormalities are  seen.  IMPRESSION: No acute cardiopulmonary process seen.   Original Report Authenticated By: Tonia Ghent, M.D.   Ct Angio Chest Pe W/cm &/or Wo Cm  01/31/2013   *RADIOLOGY REPORT*  Clinical Data: Tachycardia and tachypnea; fever.  CT ANGIOGRAPHY CHEST  Technique:  Multidetector CT imaging of the chest using the standard protocol during bolus administration of intravenous contrast. Multiplanar reconstructed images including MIPs were obtained and reviewed to evaluate the vascular anatomy.  Contrast: 80mL OMNIPAQUE IOHEXOL 350 MG/ML SOLN  Comparison: Chest radiograph performed earlier today at 01:26 a.m.  Findings: There is no evidence of pulmonary embolus.  Mild bibasilar atelectasis is noted.  The lungs are otherwise clear.  There is no evidence of significant focal consolidation, pleural effusion or pneumothorax.  No masses are identified; no abnormal focal contrast enhancement is seen.  The mediastinum is unremarkable in appearance.  No mediastinal lymphadenopathy is seen.  No pericardial effusion is identified. The great vessels are grossly unremarkable in appearance. Incidental note is made of a direct origin of the left vertebral artery from the aortic arch.  No axillary lymphadenopathy is seen. The visualized portions of the thyroid gland are unremarkable in appearance.  The visualized portions of the liver and spleen are unremarkable. The visualized portions of the pancreas, stomach, adrenal glands and kidneys are within normal limits.  No acute osseous  abnormalities are seen.  IMPRESSION:  1.  No evidence of pulmonary embolus. 2.  Mild bibasilar atelectasis noted; lungs otherwise clear.   Original Report Authenticated By: Tonia Ghent, M.D.    Jeoffrey Massed, MD  [facility] Pager:[phone]  If 7PM-7AM, please contact night-coverage [email or web address] Password [email or web address] 02/01/2013, 9:00 AM   LOS: 2 days

## 2013-02-01 NOTE — Progress Notes (Signed)
INITIAL NUTRITION ASSESSMENT  DOCUMENTATION CODES Per approved criteria  -Not Applicable   INTERVENTION: 1. Ensure Complete po BID, each supplement provides 350 kcal and 13 grams of protein.   NUTRITION DIAGNOSIS: Inadequate oral intake related to poor appetite as evidenced by weight loss.   Goal: PO intake to meet >/=90% estimated nutrition needs.   Monitor:  PO intake, weight trends, labs, I/O's  Reason for Assessment: Malnutrition screening tool  32 y.o. female  Admitting Dx: Pyelonephritis  ASSESSMENT: Pt admitted with increased abdominal/flank pain. + for UTI/pyelonephritis.  Pt does not speak [place], interpreter line used. Pt reports she has had a poor appetite and lost about 6-8 pounds.  Per RN, pt continues to breast feed, though asked to hold off at this time with medications.  Pt is willing to try nutrition supplements for weight gain.    Height: Ht Readings from Last 1 Encounters:  01/31/13 5\' 4"  (1.626 m)    Weight: Wt Readings from Last 1 Encounters:  01/31/13 121 lb 9.6 oz (55.157 kg)    Ideal Body Weight: 120 lbs   % Ideal Body Weight: 100%  Wt Readings from Last 10 Encounters:  01/31/13 121 lb 9.6 oz (55.157 kg)  08/21/11 127 lb 1.6 oz (57.652 kg)  06/21/11 123 lb (55.792 kg)  04/05/11 135 lb (61.236 kg)    Usual Body Weight: 127 lbs   % Usual Body Weight: 95%  BMI:  Body mass index is 20.86 kg/(m^2). WNL   Estimated Nutritional Needs: Kcal: 1600-1800 (with additional 300-500 kcal if continues to breast feed) Protein: 55-65 gm  Fluid: 1.6-1.8 L   Skin: intact   Diet Order: General  EDUCATION NEEDS: -No education needs identified at this time   Intake/Output Summary (Last 24 hours) at 02/01/13 1106 Last data filed at 02/01/13 0637  Gross per 24 hour  Intake 961.67 ml  Output      0 ml  Net 961.67 ml    Last BM: PTA    Labs:   Recent Labs Lab 01/30/13 2328 02/01/13 0538  NA 136 137  K 3.5 3.5  CL 100 106  CO2  23 24  BUN 13 6  CREATININE 0.58 0.52  CALCIUM 9.0 8.2*  MG 1.8  --   GLUCOSE 103* 104*    CBG (last 3)  No results found for this basename: GLUCAP,  in the last 72 hours  Scheduled Meds: . cefTRIAXone (ROCEPHIN)  IV  1 g Intravenous Q24H  . enoxaparin (LOVENOX) injection  40 mg Subcutaneous Q24H  . sodium chloride  3 mL Intravenous Q12H    Continuous Infusions:   Past Medical History  Diagnosis Date  . Anemia     Past Surgical History  Procedure Laterality Date  . Female circumcision    . No past surgeries      Clarene Duke RD, LDN Pager [phone] After Hours pager [phone]

## 2013-02-02 DIAGNOSIS — R634 Abnormal weight loss: Secondary | ICD-10-CM

## 2013-02-02 DIAGNOSIS — R7881 Bacteremia: Secondary | ICD-10-CM | POA: Diagnosis present

## 2013-02-02 LAB — CULTURE, BLOOD (ROUTINE X 2)

## 2013-02-02 LAB — URINE CULTURE: Colony Count: >=100000

## 2013-02-02 MED ORDER — CIPROFLOXACIN HCL 750 MG PO TABS: 750.0000 mg | ORAL_TABLET | Freq: Two times a day (BID) | ORAL | Status: DC

## 2013-02-02 MED ORDER — ENSURE COMPLETE PO LIQD: 237.0000 mL | Freq: Two times a day (BID) | ORAL | Status: DC

## 2013-02-02 MED ORDER — HYDROCODONE-ACETAMINOPHEN 5-325 MG PO TABS: 1.0000 | ORAL_TABLET | Freq: Three times a day (TID) | ORAL | Status: DC | PRN

## 2013-02-02 NOTE — Care Management Note (Signed)
    Page 1 of 1   02/02/2013     1:27:25 PM   CARE MANAGEMENT NOTE 02/02/2013  Patient:  Karen Berry, Karen Berry   Account Number:  [id number]  Date Initiated:  02/02/2013  Documentation initiated by:  Letha Cape  Subjective/Objective Assessment:   dx pyelonephritis  admit- lives with spouse. speaks [place].     Action/Plan:   Anticipated DC Date:  02/02/2013   Anticipated DC Plan:  HOME/SELF CARE      DC Planning Services  CM consult      Choice offered to / List presented to:             Status of service:  Completed, signed off Medicare Important Message given?   (If response is "NO", the following Medicare IM given date fields will be blank) Date Medicare IM given:   Date Additional Medicare IM given:    Discharge Disposition:  HOME/SELF CARE  Per UR Regulation:  Reviewed for med. necessity/level of care/duration of stay  If discussed at Long Length of Stay Meetings, dates discussed:    Comments:  02/02/13 13:26 Letha Cape RN, BSN [phone] patient lives with spouse, pta indep.  No needs anticiapted.

## 2013-02-02 NOTE — Progress Notes (Signed)
Karen Berry to be D/C'd Home per MD order.  Discussed with the patient and all questions fully answered.    Medication List    TAKE these medications       acetaminophen 325 MG tablet  Commonly known as:  TYLENOL  Take 650 mg by mouth every 6 (six) hours as needed for pain.     ciprofloxacin 750 MG tablet  Commonly known as:  CIPRO  Take 1 tablet (750 mg total) by mouth 2 (two) times daily.     feeding supplement Liqd  Take 237 mLs by mouth 2 (two) times daily between meals.     HYDROcodone-acetaminophen 5-325 MG per tablet  Commonly known as:  NORCO/VICODIN  Take 1 tablet by mouth every 8 (eight) hours as needed for pain.        VVS, Skin clean, dry and intact without evidence of skin break down, no evidence of skin tears noted. IV catheter discontinued intact. Site without signs and symptoms of complications. Dressing and pressure applied.  An After Visit Summary was printed and given to the patient. Patient escorted via WC, and D/C home via private auto.  Kennyth Arnold D 02/02/2013 12:25 PM

## 2013-02-02 NOTE — Discharge Summary (Signed)
Physician Discharge Summary  Karen Berry ZOX:[id number] DOB: November 09, 1980 DOA: 01/30/2013  PCP: Quitman Livings, MD  Admit date: 01/30/2013 Discharge date: 02/02/2013  Time spent: 40  minutes  Recommendations for Outpatient Follow-up:  Please see your primary care physician in 2 weeks to ensure resolution of your infection.   Discharge Diagnoses:  Principal Problem:   Pyelonephritis Active Problems:   IUD (intrauterine device) in place   Loss of weight   Bacteremia    Discharge Condition: stable, improved.  Pain decreased.  Eating.  Diet recommendation: as tolerated.  Filed Weights   01/31/13 0510  Weight: 55.157 kg (121 lb 9.6 oz)    History of present illness:  Karen Berry is an 32 y.o. female [place] with benign PMH including anemia, s/p IUD, presents to the ER with 5 days of general malaise, nausea, dyuria, urgency and frequency. She has bilateral low back pain with left flank pain. She denied vaginal discharge, dyspareunia, abdominal pain, chest pain, cough, or shortness of breath. Work up in the ER included no leukocytosis, normal HB, kidney fx tests, and LFTs. Her UA is positive for TNTCs WBC and rare bact. Her CXR was clear and a CTPA showed no PE. Her pregnancy test is negative. Hospitalist was asked to admit her for possible PNA and IV Rocephin and Zithromax was given. Other than tachycardia to 110 when febrile (up to 103F) , she maintained her hemodynamic stability in the ER.  She was admitted with suspected pyelonephritis rather than pulmonary infection.   Hospital Course:  Pyelonephritis with Gram neg Bacteremia  -admitted with Fever and B/L flank/back pain.  -UA/Back pain suggestive of Pyelonephritis. -abd ultrasound 5/25-left kidney show a heterogeneous echotexture which could go along with a clinical suspicion of  Pyelonephritis.No evidence of hydronephrosis or abscess. - Initially on Zosyn.Levaquin-but narrowed on 5/25 to IV Rocephin.  -Afebrile x 48 hours.   Clinically much improved. And will be discharged on ciprofloxacin for another 12 more days to complete a 14 day course of antibiotics. Patient has been asked not to breast-feed while on fluoroquinolones. -Urine cx + for ecoli sensitive to rocephin and cipro. -Blood cx 5/25-gram neg rods, e-coli. Sensitive to rocephin and cipro.   Discharge Exam: Filed Vitals:   02/01/13 0441 02/01/13 1400 02/01/13 2143 02/02/13 0553  BP: 100/65 105/71 98/68 109/74  Pulse: 74 98 71 68  Temp: 98.7 F (37.1 C) 98.7 F (37.1 C) 98.8 F (37.1 C) 98.3 F (36.8 C)  TempSrc: Oral Oral Oral Oral  Resp: 16 18 16 15   Height:      Weight:      SpO2: 99% 100% 96% 100%    General: A&O NAD, Appears wells, afebrile, eating well Cardiovascular: rrr no m/r/g, no lower ext edema Respiratory: cta no w/c/r, no accessory muscle use Abdomen:  Soft, nt, nd, +bs, no masses, minimal CVA tenderness on the right.  Discharge Instructions      Discharge Orders   Future Orders Complete By Expires     Diet - low sodium heart healthy  As directed     Increase activity slowly  As directed         Medication List    TAKE these medications       acetaminophen 325 MG tablet  Commonly known as:  TYLENOL  Take 650 mg by mouth every 6 (six) hours as needed for pain.     ciprofloxacin 750 MG tablet  Commonly known as:  CIPRO  Take 1 tablet (750 mg total) by mouth  2 (two) times daily.     feeding supplement Liqd  Take 237 mLs by mouth 2 (two) times daily between meals.     HYDROcodone-acetaminophen 5-325 MG per tablet  Commonly known as:  NORCO/VICODIN  Take 1 tablet by mouth every 8 (eight) hours as needed for pain.       No Known Allergies Follow-up Information   Follow up with [facility], [facility]. Schedule an appointment as soon as possible for a visit in 2 weeks.   Contact information:   [address] [place] [address] [phone]        The results of significant diagnostics from  this hospitalization (including imaging, microbiology, ancillary and laboratory) are listed below for reference.    Significant Diagnostic Studies: Dg Chest 2 View  01/31/2013   **ADDENDUM** CREATED: 01/31/2013 02:06:11  Given the patient's symptoms, minimal nodularity at the right lung base could reflect mild infiltrate.  Alternatively, it could reflect normal vasculature.  **END ADDENDUM** SIGNED BY: Tonia Ghent, M.D.  01/31/2013   *RADIOLOGY REPORT*  Clinical Data: Chest pain, chills, weakness and dizziness; upper back pain.  CHEST - 2 VIEW  Comparison: None.  Findings: The lungs are well-aerated and clear.  There is no evidence of focal opacification, pleural effusion or pneumothorax.  The heart is normal in size; the mediastinal contour is within normal limits.  No acute osseous abnormalities are seen.  IMPRESSION: No acute cardiopulmonary process seen.   Original Report Authenticated By: Tonia Ghent, M.D.   Ct Angio Chest Pe W/cm &/or Wo Cm  01/31/2013   *RADIOLOGY REPORT*  Clinical Data: Tachycardia and tachypnea; fever.  CT ANGIOGRAPHY CHEST  Technique:  Multidetector CT imaging of the chest using the standard protocol during bolus administration of intravenous contrast. Multiplanar reconstructed images including MIPs were obtained and reviewed to evaluate the vascular anatomy.  Contrast: 80mL OMNIPAQUE IOHEXOL 350 MG/ML SOLN  Comparison: Chest radiograph performed earlier today at 01:26 a.m.  Findings: There is no evidence of pulmonary embolus.  Mild bibasilar atelectasis is noted.  The lungs are otherwise clear.  There is no evidence of significant focal consolidation, pleural effusion or pneumothorax.  No masses are identified; no abnormal focal contrast enhancement is seen.  The mediastinum is unremarkable in appearance.  No mediastinal lymphadenopathy is seen.  No pericardial effusion is identified. The great vessels are grossly unremarkable in appearance. Incidental note is made of a direct  origin of the left vertebral artery from the aortic arch.  No axillary lymphadenopathy is seen. The visualized portions of the thyroid gland are unremarkable in appearance.  The visualized portions of the liver and spleen are unremarkable. The visualized portions of the pancreas, stomach, adrenal glands and kidneys are within normal limits.  No acute osseous abnormalities are seen.  IMPRESSION:  1.  No evidence of pulmonary embolus. 2.  Mild bibasilar atelectasis noted; lungs otherwise clear.   Original Report Authenticated By: Tonia Ghent, M.D.   US Renal  01/31/2013   *RADIOLOGY REPORT*  Clinical Data: Fever.  Left-sided pain.  Assess for pyelonephritis.  RENAL/URINARY TRACT ULTRASOUND COMPLETE  Comparison:  CT chest same day.  Findings:  Right Kidney:  Normal appearance measuring 11.2 cm in length. Normal echogenicity.  No cyst, mass, stone or hydronephrosis.  Left Kidney:  10.9 cm in length.  Heterogeneous echopattern raising the possibility of pyelonephritis.  No evidence of hydronephrosis or focal abscess.  No other focal finding.  Bladder:  Normal  IMPRESSION: The left kidney is  normal in size, but does show a heterogeneous echotexture which could go along with a clinical suspicion of pyelonephritis.  No evidence of hydronephrosis or abscess.   Original Report Authenticated By: Paulina Fusi, M.D.    Microbiology: Recent Results (from the past 240 hour(s))  URINE CULTURE     Status: None   Collection Time    01/31/13 12:20 AM      Result Value Range Status   Specimen Description URINE, CLEAN CATCH   Final   Special Requests CX ADDED 0205 ON [id number]   Final   Culture  Setup Time 01/31/2013 02:15   Final   Colony Count >=100,000 COLONIES/ML   Final   Culture ESCHERICHIA COLI   Final   Report Status 02/02/2013 FINAL   Final   Organism ID, Bacteria ESCHERICHIA COLI   Final  CULTURE, BLOOD (ROUTINE X 2)     Status: None   Collection Time    01/31/13  2:25 AM      Result Value Range Status    Specimen Description BLOOD RIGHT ARM   Final   Special Requests     Final   Value: BOTTLES DRAWN AEROBIC AND ANAEROBIC 10CC BLUE 7CC RED   Culture  Setup Time 01/31/2013 12:42   Final   Culture     Final   Value: ESCHERICHIA COLI     25 Note: Gram Stain Report Called to,Read Back By and Verified With: MARIE TROGDEN  5 2014  2233PM   Report Status 02/02/2013 FINAL   Final   Organism ID, Bacteria ESCHERICHIA COLI   Final  CULTURE, BLOOD (ROUTINE X 2)     Status: None   Collection Time    01/31/13  2:27 AM      Result Value Range Status   Specimen Description BLOOD LEFT ARM   Final   Special Requests BOTTLES DRAWN AEROBIC ONLY 10CC   Final   Culture  Setup Time 01/31/2013 12:42   Final   Culture     Final   Value:        BLOOD CULTURE RECEIVED NO GROWTH TO DATE CULTURE WILL BE HELD FOR 5 DAYS BEFORE ISSUING A FINAL NEGATIVE REPORT   Report Status PENDING   Incomplete     Labs: Basic Metabolic Panel:  Recent Labs Lab 01/30/13 2328 02/01/13 0538  NA 136 137  K 3.5 3.5  CL 100 106  CO2 23 24  GLUCOSE 103* 104*  BUN 13 6  CREATININE 0.58 0.52  CALCIUM 9.0 8.2*  MG 1.8  --    Liver Function Tests:  Recent Labs Lab 01/30/13 2328  AST 23  ALT 16  ALKPHOS 68  BILITOT 0.5  PROT 7.7  ALBUMIN 3.8   CBC:  Recent Labs Lab 01/30/13 2328 02/01/13 0538  WBC 9.2 6.1  NEUTROABS 8.2*  --   HGB 13.7 12.4  HCT 38.9 37.2  MCV 84.7 86.7  PLT 223 202    Signed:  Conley Canal  [facility] 02/02/2013, 11:14 AM

## 2013-02-06 LAB — CULTURE, BLOOD (ROUTINE X 2)

## 2013-11-14 ENCOUNTER — Encounter (HOSPITAL_COMMUNITY): Payer: Self-pay | Admitting: Emergency Medicine

## 2013-11-14 ENCOUNTER — Emergency Department (HOSPITAL_COMMUNITY)
Admission: EM | Admit: 2013-11-14 | Discharge: 2013-11-14 | Disposition: A | Discharge status: Home / Self Care | Payer: Medicaid Other | Attending: Emergency Medicine | Admitting: Emergency Medicine

## 2013-11-14 DIAGNOSIS — Z862 Personal history of diseases of the blood and blood-forming organs and certain disorders involving the immune mechanism: Secondary | ICD-10-CM | POA: Insufficient documentation

## 2013-11-14 DIAGNOSIS — N12 Tubulo-interstitial nephritis, not specified as acute or chronic: Secondary | ICD-10-CM | POA: Insufficient documentation

## 2013-11-14 DIAGNOSIS — Z3202 Encounter for pregnancy test, result negative: Secondary | ICD-10-CM | POA: Insufficient documentation

## 2013-11-14 DIAGNOSIS — Z79899 Other long term (current) drug therapy: Secondary | ICD-10-CM | POA: Insufficient documentation

## 2013-11-14 DIAGNOSIS — E079 Disorder of thyroid, unspecified: Secondary | ICD-10-CM | POA: Insufficient documentation

## 2013-11-14 HISTORY — DX: Disorder of thyroid, unspecified: E07.9

## 2013-11-14 LAB — COMPREHENSIVE METABOLIC PANEL
ALT: 10 U/L (ref 0–35)
AST: 15 U/L (ref 0–37)
Albumin: 3.8 g/dL (ref 3.5–5.2)
Alkaline Phosphatase: 55 U/L (ref 39–117)
BILIRUBIN TOTAL: 0.3 mg/dL (ref 0.3–1.2)
BUN: 9 mg/dL (ref 6–23)
CALCIUM: 9.1 mg/dL (ref 8.4–10.5)
CHLORIDE: 101 meq/L (ref 96–112)
CO2: 24 meq/L (ref 19–32)
CREATININE: 0.58 mg/dL (ref 0.50–1.10)
GLUCOSE: 97 mg/dL (ref 70–99)
Potassium: 3.8 mEq/L (ref 3.7–5.3)
SODIUM: 138 meq/L (ref 137–147)
Total Protein: 7.8 g/dL (ref 6.0–8.3)

## 2013-11-14 LAB — CBC WITH DIFFERENTIAL/PLATELET
Basophils Absolute: 0 10*3/uL (ref 0.0–0.1)
Basophils Relative: 0 % (ref 0–1)
EOS PCT: 3 % (ref 0–5)
Eosinophils Absolute: 0.2 10*3/uL (ref 0.0–0.7)
HEMATOCRIT: 42 % (ref 36.0–46.0)
HEMOGLOBIN: 14.7 g/dL (ref 12.0–15.0)
LYMPHS ABS: 2.4 10*3/uL (ref 0.7–4.0)
LYMPHS PCT: 35 % (ref 12–46)
MCH: 29.9 pg (ref 26.0–34.0)
MCHC: 35 g/dL (ref 30.0–36.0)
MCV: 85.5 fL (ref 78.0–100.0)
MONO ABS: 0.4 10*3/uL (ref 0.1–1.0)
Monocytes Relative: 6 % (ref 3–12)
Neutro Abs: 3.8 10*3/uL (ref 1.7–7.7)
Neutrophils Relative %: 56 % (ref 43–77)
Platelets: 294 10*3/uL (ref 150–400)
RBC: 4.91 MIL/uL (ref 3.87–5.11)
RDW: 12.1 % (ref 11.5–15.5)
WBC: 6.8 10*3/uL (ref 4.0–10.5)

## 2013-11-14 LAB — URINE MICROSCOPIC-ADD ON

## 2013-11-14 LAB — URINALYSIS, ROUTINE W REFLEX MICROSCOPIC
Bilirubin Urine: NEGATIVE
Glucose, UA: NEGATIVE mg/dL
HGB URINE DIPSTICK: NEGATIVE
KETONES UR: NEGATIVE mg/dL
Nitrite: POSITIVE — AB
PROTEIN: NEGATIVE mg/dL
Specific Gravity, Urine: 1.022 (ref 1.005–1.030)
UROBILINOGEN UA: 0.2 mg/dL (ref 0.0–1.0)
pH: 7 (ref 5.0–8.0)

## 2013-11-14 LAB — POC URINE PREG, ED: Preg Test, Ur: NEGATIVE

## 2013-11-14 MED ORDER — ONDANSETRON 8 MG PO TBDP: ORAL_TABLET | ORAL | Status: DC

## 2013-11-14 MED ORDER — LIDOCAINE HCL (PF) 1 % IJ SOLN
INTRAMUSCULAR | Status: AC
  Administered 2013-11-14: 2.1 mL via INTRAMUSCULAR
  Filled 2013-11-14: qty 5

## 2013-11-14 MED ORDER — CEPHALEXIN 500 MG PO CAPS: ORAL_CAPSULE | ORAL | Status: DC

## 2013-11-14 MED ORDER — CEFTRIAXONE SODIUM 1 G IJ SOLR
1.0000 g | Freq: Once | INTRAMUSCULAR | Status: AC
  Administered 2013-11-14: 1 g via INTRAMUSCULAR
  Filled 2013-11-14: qty 10

## 2013-11-14 NOTE — Discharge Instructions (Signed)

## 2013-11-14 NOTE — ED Notes (Signed)
Pt is here with painful urination, abdominal and lower back pain

## 2013-11-14 NOTE — ED Provider Notes (Signed)
CSN: [id number]     Arrival date & time 11/14/13  1523 History   First MD Initiated Contact with Patient 11/14/13 1649     Chief Complaint  Patient presents with  . Dysuria  . Back Pain  . Abdominal Pain     (Consider location/radiation/quality/duration/timing/severity/associated sxs/prior Treatment) HPI 33 year old female with several days gradual onset dysuria with left-sided abdominal pain left flank pain no fever no vomiting no shortness breath no rash no vaginal bleeding no vaginal discharge no treatment prior to arrival pain is mild like prior pyelonephritis. Past Medical History  Diagnosis Date  . Anemia   . Thyroid disease    Past Surgical History  Procedure Laterality Date  . Female circumcision    . No past surgeries     No family history on file. History  Substance Use Topics  . Smoking status: Never Smoker   . Smokeless tobacco: Not on file  . Alcohol Use: No   OB History   Grav Para Term Preterm Abortions TAB SAB Ect Mult Living   5 5 5  0 0 0 0 0 2 6     Review of Systems  10 Systems reviewed and are negative for acute change except as noted in the HPI.  Allergies  Review of patient's allergies indicates no known allergies.  Home Medications   Current Outpatient Rx  Name  Route  Sig  Dispense  Refill  . ibuprofen (ADVIL,MOTRIN) 200 MG tablet   Oral   Take 200 mg by mouth daily as needed for mild pain.         Marland Kitchen levothyroxine (SYNTHROID, LEVOTHROID) 25 MCG tablet   Oral   Take 25 mcg by mouth daily before breakfast.         . cephALEXin (KEFLEX) 500 MG capsule      2 caps po bid x 7 days   28 capsule   0   . ondansetron (ZOFRAN ODT) 8 MG disintegrating tablet      8mg  ODT q4 hours prn nausea   4 tablet   0    BP 93/56  Pulse 73  Temp(Src) 98.8 F (37.1 C) (Oral)  Resp 16  SpO2 100%  LMP 10/30/2013 Physical Exam  Nursing note and vitals reviewed. Constitutional:  Awake, alert, nontoxic appearance.  HENT:  Head: Atraumatic.   Eyes: Right eye exhibits no discharge. Left eye exhibits no discharge.  Neck: Neck supple.  Cardiovascular: Normal rate and regular rhythm.   No murmur heard. Pulmonary/Chest: Effort normal and breath sounds normal. No respiratory distress. She has no wheezes. She has no rales. She exhibits no tenderness.  Abdominal: Soft. Bowel sounds are normal. She exhibits no distension and no mass. There is no tenderness. There is no rebound and no guarding.  Genitourinary:  Positive left CVA tenderness; chaperone present for bimanual examination which is nontender no cervical motion tenderness no blood or discharge noted on examination glove; no perineal rash or lesions noted  Musculoskeletal: She exhibits no tenderness.  Baseline ROM, no obvious new focal weakness.  Neurological: She is alert.  Mental status and motor strength appears baseline for patient and situation.  Skin: No rash noted.  Psychiatric: She has a normal mood and affect.    ED Course  Procedures (including critical care time) Patient / Family / Caregiver informed of clinical course, understand medical decision-making process, and agree with plan. Labs Review Labs Reviewed  URINALYSIS, ROUTINE W REFLEX MICROSCOPIC - Abnormal; Notable for the following:    APPearance  CLOUDY (*)    Nitrite POSITIVE (*)    Leukocytes, UA SMALL (*)    All other components within normal limits  URINE MICROSCOPIC-ADD ON - Abnormal; Notable for the following:    Bacteria, UA MANY (*)    All other components within normal limits  URINE CULTURE  CBC WITH DIFFERENTIAL  COMPREHENSIVE METABOLIC PANEL  POC URINE PREG, ED   Imaging Review No results found.   EKG Interpretation None      MDM   Final diagnoses:  Pyelonephritis    I doubt any other EMC precluding discharge at this time including, but not necessarily limited to the following:sepsis.    Babette Relic, MD 11/15/13 [phone]

## 2013-11-17 LAB — URINE CULTURE

## 2014-07-11 ENCOUNTER — Encounter (HOSPITAL_COMMUNITY): Payer: Self-pay | Admitting: Emergency Medicine

## 2015-08-27 ENCOUNTER — Encounter (HOSPITAL_COMMUNITY): Payer: Self-pay | Admitting: Nurse Practitioner

## 2015-08-27 ENCOUNTER — Emergency Department (HOSPITAL_COMMUNITY)
Admission: EM | Admit: 2015-08-27 | Discharge: 2015-08-27 | Disposition: A | Discharge status: Home / Self Care | Payer: Medicaid Other | Attending: Emergency Medicine | Admitting: Emergency Medicine

## 2015-08-27 DIAGNOSIS — Z3202 Encounter for pregnancy test, result negative: Secondary | ICD-10-CM | POA: Insufficient documentation

## 2015-08-27 DIAGNOSIS — N390 Urinary tract infection, site not specified: Secondary | ICD-10-CM | POA: Insufficient documentation

## 2015-08-27 DIAGNOSIS — E079 Disorder of thyroid, unspecified: Secondary | ICD-10-CM | POA: Insufficient documentation

## 2015-08-27 DIAGNOSIS — Z79899 Other long term (current) drug therapy: Secondary | ICD-10-CM | POA: Insufficient documentation

## 2015-08-27 DIAGNOSIS — Z862 Personal history of diseases of the blood and blood-forming organs and certain disorders involving the immune mechanism: Secondary | ICD-10-CM | POA: Insufficient documentation

## 2015-08-27 LAB — URINALYSIS, ROUTINE W REFLEX MICROSCOPIC
Bilirubin Urine: NEGATIVE
Glucose, UA: NEGATIVE mg/dL
Hgb urine dipstick: NEGATIVE
Ketones, ur: NEGATIVE mg/dL
Nitrite: NEGATIVE
PROTEIN: NEGATIVE mg/dL
Specific Gravity, Urine: 1.023 (ref 1.005–1.030)
pH: 6 (ref 5.0–8.0)

## 2015-08-27 LAB — URINE MICROSCOPIC-ADD ON

## 2015-08-27 LAB — POC URINE PREG, ED: PREG TEST UR: NEGATIVE

## 2015-08-27 NOTE — Discharge Instructions (Signed)
You were seen in the emergency room today for evaluation of flank pain and burning when you urinate (dysuria). Your urinalysis shows evidence of infection. I will give you a prescription for antibiotics to take for one week. Please follow-up with your primary care provider within one week. Return to the ER for new or worsening symptoms.

## 2015-08-27 NOTE — ED Notes (Signed)
She c/o lower back pain x 2 days, dysuria and urinary frequency onset today. She denies n/v/fevers.

## 2015-08-27 NOTE — ED Provider Notes (Signed)
CSN: [id number]     Arrival date & time 08/27/15  1743 History   First MD Initiated Contact with Patient 08/27/15 1814     Chief Complaint  Patient presents with  . Dysuria   HPI   Karen Berry is an 34 y.o. female with history of anemia, UTIs, pyelo who presents to the ED for evaluation of flank pain and dysuria. She is accompanied by her husband who acts as Optometrist. She states she started having bilateral low back and flank pain 2 days ago. States that today she has had intermittent dysuria. Denies gross hematuria, urinary frequency/urgency. Denies fever, chills, N/V/D. Endorses lower abdominal pain. States she has had UTIs before but does not remember which antibiotic she has been prescribed.   Past Medical History  Diagnosis Date  . Anemia   . Thyroid disease    Past Surgical History  Procedure Laterality Date  . Female circumcision    . No past surgeries     History reviewed. No pertinent family history. Social History  Substance Use Topics  . Smoking status: Never Smoker   . Smokeless tobacco: None  . Alcohol Use: No   OB History    Gravida Para Term Preterm AB TAB SAB Ectopic Multiple Living   5 5 5  0 0 0 0 0 2 6     Review of Systems  All other systems reviewed and are negative.     Allergies  Review of patient's allergies indicates no known allergies.  Home Medications   Prior to Admission medications   Medication Sig Start Date End Date Taking? Authorizing Provider  cephALEXin (KEFLEX) 500 MG capsule 2 caps po bid x 7 days 11/14/13   Riki Altes, MD  ibuprofen (ADVIL,MOTRIN) 200 MG tablet Take 200 mg by mouth daily as needed for mild pain.    Historical Provider, MD  levothyroxine (SYNTHROID, LEVOTHROID) 25 MCG tablet Take 25 mcg by mouth daily before breakfast.    Historical Provider, MD  ondansetron (ZOFRAN ODT) 8 MG disintegrating tablet 8mg  ODT q4 hours prn nausea 11/14/13   Riki Altes, MD   BP 113/75 mmHg  Pulse 75  Temp(Src) 98 F (36.7 C) (Oral)   Resp 16  Ht 5' (1.524 m)  Wt 60.782 kg  BMI 26.17 kg/m2  SpO2 100% Physical Exam  Constitutional: She is oriented to person, place, and time.  HENT:  Right Ear: External ear normal.  Left Ear: External ear normal.  Nose: Nose normal.  Mouth/Throat: Oropharynx is clear and moist. No oropharyngeal exudate.  Eyes: Conjunctivae and EOM are normal. Pupils are equal, round, and reactive to light.  Neck: Normal range of motion. Neck supple.  Cardiovascular: Normal rate, regular rhythm, normal heart sounds and intact distal pulses.   Pulmonary/Chest: Effort normal and breath sounds normal. No respiratory distress. She has no wheezes. She exhibits no tenderness.  Abdominal: Soft. Bowel sounds are normal. She exhibits no distension. There is tenderness in the suprapubic area. There is no rebound, no guarding and no CVA tenderness.  Musculoskeletal: She exhibits no edema.  Neurological: She is alert and oriented to person, place, and time. No cranial nerve deficit.  Skin: Skin is warm and dry.  Psychiatric: She has a normal mood and affect.  Nursing note and vitals reviewed.   ED Course  Procedures (including critical care time) Labs Review Labs Reviewed  URINALYSIS, ROUTINE W REFLEX MICROSCOPIC (NOT AT [facility]) - Abnormal; Notable for the following:    APPearance CLOUDY (*)  Leukocytes, UA MODERATE (*)    All other components within normal limits  URINE MICROSCOPIC-ADD ON - Abnormal; Notable for the following:    Squamous Epithelial / LPF 0-5 (*)    Bacteria, UA RARE (*)    All other components within normal limits  URINE CULTURE  POC URINE PREG, ED    Imaging Review No results found. I have personally reviewed and evaluated these images and lab results as part of my medical decision-making.   EKG Interpretation None      MDM   Final diagnoses:  Urinary tract infection without hematuria, site unspecified    Pt with dysuria, flank pain, abdominal pain. UA shows moderate  leuks, no nitrites, rare bacteria. Otherwise afebrile, not tachycardic. Given symptoms will treat with Keflex as an outpatient. Prior urine cultures show susceptibility. Will send urine for culture. PCP f/u. ER return precautions given. Pt and her husband verbalize understanding.   Cannot access orders area to print rx. Handwritten rx for keflex 500mg  TID x 7 days given.   Anne Ng, PA-C 08/27/15 1937  Carmin Muskrat, MD 08/27/15 [phone]

## 2015-08-28 LAB — URINE CULTURE: Culture: 2000

## 2015-09-22 ENCOUNTER — Encounter (HOSPITAL_COMMUNITY): Payer: Self-pay | Admitting: Emergency Medicine

## 2015-09-22 ENCOUNTER — Emergency Department (HOSPITAL_COMMUNITY)
Admission: EM | Admit: 2015-09-22 | Discharge: 2015-09-23 | Disposition: A | Discharge status: Home / Self Care | Payer: Medicaid Other | Attending: Emergency Medicine | Admitting: Emergency Medicine

## 2015-09-22 ENCOUNTER — Emergency Department (HOSPITAL_COMMUNITY): Payer: Medicaid Other

## 2015-09-22 DIAGNOSIS — R079 Chest pain, unspecified: Secondary | ICD-10-CM | POA: Insufficient documentation

## 2015-09-22 DIAGNOSIS — J329 Chronic sinusitis, unspecified: Secondary | ICD-10-CM

## 2015-09-22 DIAGNOSIS — H9201 Otalgia, right ear: Secondary | ICD-10-CM | POA: Insufficient documentation

## 2015-09-22 DIAGNOSIS — E079 Disorder of thyroid, unspecified: Secondary | ICD-10-CM | POA: Insufficient documentation

## 2015-09-22 DIAGNOSIS — Z862 Personal history of diseases of the blood and blood-forming organs and certain disorders involving the immune mechanism: Secondary | ICD-10-CM | POA: Insufficient documentation

## 2015-09-22 LAB — COMPREHENSIVE METABOLIC PANEL
ALK PHOS: 70 U/L (ref 38–126)
ALT: 39 U/L (ref 14–54)
ANION GAP: 9 (ref 5–15)
AST: 28 U/L (ref 15–41)
Albumin: 3.6 g/dL (ref 3.5–5.0)
BILIRUBIN TOTAL: 0.4 mg/dL (ref 0.3–1.2)
BUN: 9 mg/dL (ref 6–20)
CALCIUM: 9.3 mg/dL (ref 8.9–10.3)
CO2: 25 mmol/L (ref 22–32)
Chloride: 105 mmol/L (ref 101–111)
Creatinine, Ser: 0.67 mg/dL (ref 0.44–1.00)
GFR calc non Af Amer: >60 mL/min (ref >60)
Glucose, Bld: 92 mg/dL (ref 65–99)
POTASSIUM: 3.5 mmol/L (ref 3.5–5.1)
Sodium: 139 mmol/L (ref 135–145)
TOTAL PROTEIN: 7.6 g/dL (ref 6.5–8.1)

## 2015-09-22 LAB — CBC
HCT: 42.2 % (ref 36.0–46.0)
HEMOGLOBIN: 14.2 g/dL (ref 12.0–15.0)
MCH: 29.2 pg (ref 26.0–34.0)
MCHC: 33.6 g/dL (ref 30.0–36.0)
MCV: 86.7 fL (ref 78.0–100.0)
Platelets: 300 10*3/uL (ref 150–400)
RBC: 4.87 MIL/uL (ref 3.87–5.11)
RDW: 12.3 % (ref 11.5–15.5)
WBC: 10.3 10*3/uL (ref 4.0–10.5)

## 2015-09-22 NOTE — ED Provider Notes (Signed)
CSN: [id number]     Arrival date & time 09/22/15  2158 History  By signing my name below, I, Karen Berry, attest that this documentation has been prepared under the direction and in the presence of Gloriann Loan, PA-C. Electronically Signed: Hansel Berry, ED Scribe. 09/22/2015. 11:56 PM.    Chief Complaint  Patient presents with  . Otalgia  . Fever  . Sore Throat   The history is provided by the patient. No language interpreter was used.   HPI Comments: Karen Berry is a 35 y.o. female with h/o anemia, thyroid disease who presents to the Emergency Department complaining of moderate subjective fever onset 4 days ago with associated frontal sinus HA, right-sided otalgia, right-sided facial pain, occasional cough, rhinorrhea, intermittent left-sided CP yesterday. Pt describes her CP as stabbing and reports that it has onset with her subjective fever, she describes it as very sporadic and lasting seconds. She reports h/o similar CP with illness. No h/o similar symptoms otherwise. Pt denies taking OTC medications at home to improve symptoms.  Pt is not on any oral contraceptives or hormones. No h/o PE/DV, CAD, CHF, MI. Pt is a non-smoker. She is followed by a PCP. She denies SOB, sore throat, myalgias, leg swelling, dental pain, nausea, emesis.    Past Medical History  Diagnosis Date  . Anemia   . Thyroid disease    Past Surgical History  Procedure Laterality Date  . Female circumcision    . No past surgeries     History reviewed. No pertinent family history. Social History  Substance Use Topics  . Smoking status: Never Smoker   . Smokeless tobacco: None  . Alcohol Use: No   OB History    Gravida Para Term Preterm AB TAB SAB Ectopic Multiple Living   5 5 5  0 0 0 0 0 2 6     Review of Systems A complete 10 system review of systems was obtained and all systems are negative except as noted in the HPI and PMH.    Allergies  Review of patient's allergies indicates no known  allergies.  Home Medications   Prior to Admission medications   Medication Sig Start Date End Date Taking? Authorizing Provider  cephALEXin (KEFLEX) 500 MG capsule 2 caps po bid x 7 days 11/14/13   Riki Altes, MD  cetirizine (ZYRTEC) 10 MG tablet Take 1 tablet (10 mg total) by mouth daily. 09/23/15   Gloriann Loan, PA-C  ibuprofen (ADVIL,MOTRIN) 800 MG tablet Take 1 tablet (800 mg total) by mouth 3 (three) times daily. 09/23/15   Gloriann Loan, PA-C  levothyroxine (SYNTHROID, LEVOTHROID) 25 MCG tablet Take 25 mcg by mouth daily before breakfast.    Historical Provider, MD  ondansetron (ZOFRAN ODT) 8 MG disintegrating tablet 8mg  ODT q4 hours prn nausea 11/14/13   Riki Altes, MD   BP 100/63 mmHg  Pulse 82  Temp(Src) 98.2 F (36.8 C) (Oral)  Resp 18  SpO2 100%  LMP 09/08/2015 Physical Exam  Constitutional: She is oriented to person, place, and time. She appears well-developed and well-nourished.  HENT:  Head: Normocephalic and atraumatic.  Right Ear: Tympanic membrane normal. No tenderness. Tympanic membrane is not injected, not erythematous and not bulging. No middle ear effusion.  Left Ear: Tympanic membrane normal. No tenderness. Tympanic membrane is not injected, not erythematous and not bulging.  No middle ear effusion.  Nose: Nose normal.  Mouth/Throat: Uvula is midline, oropharynx is clear and moist and mucous membranes are normal.  No nuchal rigidity.  Eyes: Conjunctivae and EOM are normal. Pupils are equal, round, and reactive to light.  Neck: Normal range of motion. Neck supple.  No nuchal rigidity.  Cardiovascular: Normal rate, regular rhythm and normal heart sounds.  Exam reveals no gallop and no friction rub.   No murmur heard. No lower extremity edema.  Pulmonary/Chest: Effort normal and breath sounds normal. No respiratory distress. She has no wheezes. She has no rales. She exhibits no tenderness.  Abdominal: Soft. Bowel sounds are normal. She exhibits no distension. There is no  tenderness.  Musculoskeletal: Normal range of motion.  Lymphadenopathy:    She has no cervical adenopathy.  Neurological: She is alert and oriented to person, place, and time.  Skin: Skin is warm and dry.  Psychiatric: She has a normal mood and affect. Her behavior is normal.  Nursing note and vitals reviewed.  ED Course  Procedures (including critical care time) DIAGNOSTIC STUDIES: Oxygen Saturation is 94% on RA, adequate by my interpretation.    COORDINATION OF CARE: 11:55 PM Discussed treatment plan with pt at bedside which includes CXR, lab work and pt agreed to plan.   Labs Review Labs Reviewed  COMPREHENSIVE METABOLIC PANEL  CBC    Imaging Review Dg Chest 2 View  09/22/2015  CLINICAL DATA:  Acute onset of fever, sore throat and generalized weakness. Initial encounter. EXAM: CHEST  2 VIEW COMPARISON:  Chest radiograph and CTA of the chest performed 01/31/2013 FINDINGS: The lungs are well-aerated and clear. There is no evidence of focal opacification, pleural effusion or pneumothorax. The heart is normal in size; the mediastinal contour is within normal limits. No acute osseous abnormalities are seen. IMPRESSION: No acute cardiopulmonary process seen. Electronically Signed   By: Garald Balding M.D.   On: 09/22/2015 23:16   I have personally reviewed and evaluated these images and lab results as part of my medical decision-making.   MDM   Final diagnoses:  Sinusitis, unspecified chronicity, unspecified location   Filed Vitals:   09/22/15 2210 09/23/15 0007  BP: 113/83 100/63  Pulse: 94 82  Temp: 99.1 F (37.3 C) 98.2 F (36.8 C)  Resp: 16 18    Meds given in ED:  Medications  ibuprofen (ADVIL,MOTRIN) tablet 800 mg (not administered)    New Prescriptions   CETIRIZINE (ZYRTEC) 10 MG TABLET    Take 1 tablet (10 mg total) by mouth daily.   IBUPROFEN (ADVIL,MOTRIN) 800 MG TABLET    Take 1 tablet (800 mg total) by mouth 3 (three) times daily.   Patient complaining  of symptoms of sinusitis.    Pt with Mild to moderate symptoms of clear/yellow nasal discharge/congestion and scratchy throat with cough for less than 4 days.  Patient is afebrile.  No nuchal rigidity. Doubt meningitis.  No concern for acute bacterial rhinosinusitis; likely viral in nature.  No sxs concerning for DVT/PE.  PERC negative.  Labs and CXR unremarkable.  Patient discharged with symptomatic treatment which includes Motrin, Zyrtec.  Patient instructions given for warm saline nasal washes.  Recommendations for follow-up with primary care physician in 2 days for recheck.    I personally performed the services described in this documentation, which was scribed in my presence. The recorded information has been reviewed and is accurate.   Gloriann Loan, PA-C 09/23/15 0023  Dorie Rank, MD 09/23/15 [phone]

## 2015-09-22 NOTE — ED Notes (Signed)
PA at bedside.

## 2015-09-22 NOTE — ED Notes (Signed)
Patient here with complaint of fever for 4 days coupled with sore throat, head pain, right ear pain. Also states some cough with chest discomfort.

## 2015-09-23 MED ORDER — CETIRIZINE HCL 10 MG PO TABS: 10.0000 mg | ORAL_TABLET | Freq: Every day | ORAL | Status: DC

## 2015-09-23 MED ORDER — IBUPROFEN 400 MG PO TABS
800.0000 mg | ORAL_TABLET | Freq: Once | ORAL | Status: AC
  Administered 2015-09-23: 800 mg via ORAL
  Filled 2015-09-23: qty 2

## 2015-09-23 MED ORDER — IBUPROFEN 800 MG PO TABS: 800.0000 mg | ORAL_TABLET | Freq: Three times a day (TID) | ORAL | Status: DC

## 2015-09-23 NOTE — Discharge Instructions (Signed)

## 2015-12-10 ENCOUNTER — Emergency Department (HOSPITAL_COMMUNITY): Payer: Medicaid Other

## 2015-12-10 ENCOUNTER — Emergency Department (HOSPITAL_COMMUNITY)
Admission: EM | Admit: 2015-12-10 | Discharge: 2015-12-10 | Disposition: A | Discharge status: Home / Self Care | Payer: Medicaid Other | Attending: Emergency Medicine | Admitting: Emergency Medicine

## 2015-12-10 ENCOUNTER — Encounter (HOSPITAL_COMMUNITY): Payer: Self-pay

## 2015-12-10 DIAGNOSIS — Z791 Long term (current) use of non-steroidal anti-inflammatories (NSAID): Secondary | ICD-10-CM | POA: Insufficient documentation

## 2015-12-10 DIAGNOSIS — M545 Low back pain, unspecified: Secondary | ICD-10-CM

## 2015-12-10 DIAGNOSIS — Z3202 Encounter for pregnancy test, result negative: Secondary | ICD-10-CM | POA: Insufficient documentation

## 2015-12-10 DIAGNOSIS — E079 Disorder of thyroid, unspecified: Secondary | ICD-10-CM | POA: Insufficient documentation

## 2015-12-10 DIAGNOSIS — R300 Dysuria: Secondary | ICD-10-CM | POA: Insufficient documentation

## 2015-12-10 DIAGNOSIS — Z79899 Other long term (current) drug therapy: Secondary | ICD-10-CM | POA: Insufficient documentation

## 2015-12-10 DIAGNOSIS — Z862 Personal history of diseases of the blood and blood-forming organs and certain disorders involving the immune mechanism: Secondary | ICD-10-CM | POA: Insufficient documentation

## 2015-12-10 LAB — URINALYSIS, ROUTINE W REFLEX MICROSCOPIC
Bilirubin Urine: NEGATIVE
Glucose, UA: NEGATIVE mg/dL
Hgb urine dipstick: NEGATIVE
KETONES UR: NEGATIVE mg/dL
LEUKOCYTES UA: NEGATIVE
NITRITE: NEGATIVE
PROTEIN: NEGATIVE mg/dL
Specific Gravity, Urine: 1.01 (ref 1.005–1.030)
pH: 5.5 (ref 5.0–8.0)

## 2015-12-10 LAB — LIPASE, BLOOD: Lipase: 28 U/L (ref 11–51)

## 2015-12-10 LAB — CBC
HCT: 42.6 % (ref 36.0–46.0)
Hemoglobin: 14 g/dL (ref 12.0–15.0)
MCH: 28.3 pg (ref 26.0–34.0)
MCHC: 32.9 g/dL (ref 30.0–36.0)
MCV: 86.1 fL (ref 78.0–100.0)
PLATELETS: 260 10*3/uL (ref 150–400)
RBC: 4.95 MIL/uL (ref 3.87–5.11)
RDW: 12.4 % (ref 11.5–15.5)
WBC: 6.9 10*3/uL (ref 4.0–10.5)

## 2015-12-10 LAB — COMPREHENSIVE METABOLIC PANEL
ALBUMIN: 3.7 g/dL (ref 3.5–5.0)
ALK PHOS: 52 U/L (ref 38–126)
ALT: 13 U/L — AB (ref 14–54)
AST: 18 U/L (ref 15–41)
Anion gap: 8 (ref 5–15)
BUN: 7 mg/dL (ref 6–20)
CALCIUM: 9.1 mg/dL (ref 8.9–10.3)
CHLORIDE: 105 mmol/L (ref 101–111)
CO2: 23 mmol/L (ref 22–32)
CREATININE: 0.65 mg/dL (ref 0.44–1.00)
GFR calc non Af Amer: >60 mL/min (ref >60)
GLUCOSE: 89 mg/dL (ref 65–99)
Potassium: 3.8 mmol/L (ref 3.5–5.1)
SODIUM: 136 mmol/L (ref 135–145)
Total Bilirubin: 0.4 mg/dL (ref 0.3–1.2)
Total Protein: 7.4 g/dL (ref 6.5–8.1)

## 2015-12-10 LAB — I-STAT BETA HCG BLOOD, ED (MC, WL, AP ONLY)

## 2015-12-10 MED ORDER — CYCLOBENZAPRINE HCL 10 MG PO TABS: 10.0000 mg | ORAL_TABLET | Freq: Two times a day (BID) | ORAL | Status: DC | PRN

## 2015-12-10 MED ORDER — IBUPROFEN 200 MG PO TABS
600.0000 mg | ORAL_TABLET | Freq: Once | ORAL | Status: AC
  Administered 2015-12-10: 600 mg via ORAL
  Filled 2015-12-10: qty 3

## 2015-12-10 NOTE — ED Notes (Signed)
PT up to bathroom to collect urine .

## 2015-12-10 NOTE — ED Notes (Signed)
Pt taken to CT.

## 2015-12-10 NOTE — ED Notes (Signed)
Pt here with right flank pain. She reports dysuria two days ago but not anymore. Denies hematuria, N/V/D. She reports feeling tired.

## 2015-12-10 NOTE — ED Provider Notes (Signed)
CSN: [id number]     Arrival date & time 12/10/15  1414 History   First MD Initiated Contact with Patient 12/10/15 1816     Chief Complaint  Patient presents with  . Flank Pain   (Consider location/radiation/quality/duration/timing/severity/associated sxs/prior Treatment) HPI  35 y.o. female presents to the Emergency Department today complaining of right flank pain x 2 days. States that the pain is worse with movement and better at rest. Notes twsting sensation in her right lower back. States pain is 10/10. Difficulty to find comfort. No trauma noted to area. No lifting injury. No fevers. No CP/SOB/ABD pain. No N/V/D. Noted Dysuria x 2 days ago, but none today. No hematuria. No other symptoms noted.  Past Medical History  Diagnosis Date  . Anemia   . Thyroid disease    Past Surgical History  Procedure Laterality Date  . Female circumcision    . No past surgeries     No family history on file. Social History  Substance Use Topics  . Smoking status: Never Smoker   . Smokeless tobacco: None  . Alcohol Use: No   OB History    Gravida Para Term Preterm AB TAB SAB Ectopic Multiple Living   5 5 5  0 0 0 0 0 2 6     Review of Systems ROS reviewed and all are negative for acute change except as noted in the HPI.  Allergies  Review of patient's allergies indicates no known allergies.  Home Medications   Prior to Admission medications   Medication Sig Start Date End Date Taking? Authorizing Provider  cephALEXin (KEFLEX) 500 MG capsule 2 caps po bid x 7 days 11/14/13   Riki Altes, MD  cetirizine (ZYRTEC) 10 MG tablet Take 1 tablet (10 mg total) by mouth daily. 09/23/15   Gloriann Loan, PA-C  ibuprofen (ADVIL,MOTRIN) 800 MG tablet Take 1 tablet (800 mg total) by mouth 3 (three) times daily. 09/23/15   Gloriann Loan, PA-C  levothyroxine (SYNTHROID, LEVOTHROID) 25 MCG tablet Take 25 mcg by mouth daily before breakfast.    Historical Provider, MD  ondansetron (ZOFRAN ODT) 8 MG disintegrating  tablet 8mg  ODT q4 hours prn nausea 11/14/13   Riki Altes, MD   BP 112/68 mmHg  Pulse 67  Temp(Src) 98.7 F (37.1 C) (Oral)  Resp 18  SpO2 100%   Physical Exam  Constitutional: She is oriented to person, place, and time. She appears well-developed and well-nourished.  HENT:  Head: Normocephalic and atraumatic.  Eyes: EOM are normal. Pupils are equal, round, and reactive to light.  Neck: Normal range of motion. Neck supple. No tracheal deviation present.  Cardiovascular: Normal rate, regular rhythm and normal heart sounds.   No murmur heard. Pulmonary/Chest: Effort normal and breath sounds normal. No respiratory distress. She has no wheezes. She has no rales. She exhibits no tenderness.  Abdominal: Soft. There is no tenderness. There is CVA tenderness (right). There is no rigidity, no rebound, no guarding, no tenderness at McBurney's point and negative Murphy's sign.  Musculoskeletal: Normal range of motion.  Neurological: She is alert and oriented to person, place, and time.  Skin: Skin is warm and dry.  Psychiatric: She has a normal mood and affect. Her behavior is normal. Thought content normal.  Nursing note and vitals reviewed.  ED Course  Procedures (including critical care time) Labs Review Labs Reviewed  COMPREHENSIVE METABOLIC PANEL - Abnormal; Notable for the following:    ALT 13 (*)    All other components within normal limits  LIPASE, BLOOD  CBC  URINALYSIS, ROUTINE W REFLEX MICROSCOPIC (NOT AT [facility])  I-STAT BETA HCG BLOOD, ED ([facility], [facility], [facility] ONLY)   Imaging Review Ct Renal Stone Study  12/10/2015  CLINICAL DATA:  Right-sided flank pain for 2 days EXAM: CT ABDOMEN AND PELVIS WITHOUT CONTRAST TECHNIQUE: Multidetector CT imaging of the abdomen and pelvis was performed following the standard protocol without IV contrast. COMPARISON:  None. FINDINGS: Lung bases are free of acute infiltrate or sizable effusion. The liver, gallbladder, spleen, adrenal glands and pancreas are  within normal limits. The left kidney is well visualized and demonstrates no renal calculi or obstructive changes. Right kidney demonstrates no renal calculi or obstructive changes. No ureteral calculi are seen. The appendix is well visualized and within normal limits. An IUD is noted in place. No free pelvic fluid is seen. The bladder is decompressed. No acute bony abnormality is noted. IMPRESSION: No acute abnormality is noted Electronically Signed   By: Inez Catalina M.D.   On: 12/10/2015 20:43   I have personally reviewed and evaluated these images and lab results as part of my medical decision-making.   EKG Interpretation None      MDM  I have reviewed and evaluated the relevant laboratory values.I have reviewed and evaluated the relevant imaging studies..I have reviewed the relevant previous healthcare records.I obtained HPI from historian.  ED Course:  Assessment: Pt is a 35yF who presents with right flank pain x 2 days. Colicky in nature. Suspect kidney stone vs musculoskeletal. No warning symptoms of back pain including: fecal incontinence, urinary retention or overflow incontinence, night sweats, waking from sleep with back pain, unexplained fevers or weight loss, h/o cancer, IVDU, recent trauma. No concern for cauda equina, epidural abscess, or other serious cause of back pain. On exam, pt in NAD. Nontoxic/nonseptic appearing. VSS. Afebrile. Lungs CTA. Heart RRR. Abdomen nontender soft. CVA tenderness right side. Labs unremarkable. UA unreamarkable. CT Renal shows no stone. Plan is to DC home with analgesia. Most likely musculoskeletal. At time of discharge, Patient is in no acute distress. Vital Signs are stable. Patient is able to ambulate. Patient able to tolerate PO.    Disposition/Plan:  DC home Additional Verbal discharge instructions given and discussed with patient.  Pt Instructed to f/u with PCp in the next week for evaluation and treatment of symptoms. Return precautions  given Pt acknowledges and agrees with plan  Supervising Physician Pattricia Boss, MD   Final diagnoses:  Right-sided low back pain without sciatica     Shary Decamp, PA-C 12/11/15 0003  Pattricia Boss, MD 12/11/15 1721

## 2015-12-10 NOTE — Discharge Instructions (Signed)
Please read and follow all provided instructions.  Your diagnoses today include:  1. Right-sided low back pain without sciatica    Tests performed today include:  Vital signs - see below for your results today  Medications prescribed:   Take any prescribed medications only as directed.  Home care instructions:   Follow any educational materials contained in this packet  Please rest, use ice or heat on your back for the next several days  Do not lift, push, pull anything more than 10 pounds for the next week  Follow-up instructions: Please follow-up with your primary care provider in the next 1 week for further evaluation of your symptoms.   Return instructions:  SEEK IMMEDIATE MEDICAL ATTENTION IF YOU HAVE:  New numbness, tingling, weakness, or problem with the use of your arms or legs  Severe back pain not relieved with medications  Loss control of your bowels or bladder  Increasing pain in any areas of the body (such as chest or abdominal pain)  Shortness of breath, dizziness, or fainting.   Worsening nausea (feeling sick to your stomach), vomiting, fever, or sweats  Any other emergent concerns regarding your health   Additional Information:  Your vital signs today were: BP 112/68 mmHg   Pulse 67   Temp(Src) 98.7 F (37.1 C) (Oral)   Resp 18   SpO2 100% If your blood pressure (BP) was elevated above 135/85 this visit, please have this repeated by your doctor within one month. --------------

## 2016-11-28 IMAGING — CT CT RENAL STONE PROTOCOL
2 of 3 series · 16 of 46 positions shown, 18 images · non-contrast
Comparison: None.

CLINICAL DATA: Right-sided flank pain for 2 days

EXAM:
CT ABDOMEN AND PELVIS WITHOUT CONTRAST
TECHNIQUE: Multidetector CT imaging of the abdomen and pelvis was performed
following the standard protocol without IV contrast.

[Series 2: renal stone 5mm · axial · 0.66mm/px · z∈[-450,-70]mm · 13 of 88 slices shown, 15 images]
[im 6/88  soft-tissue]
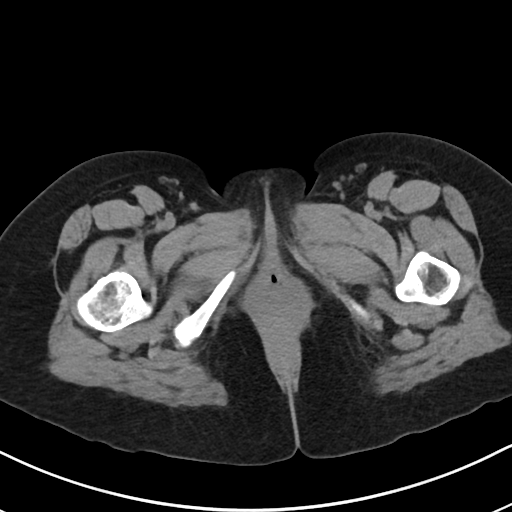
[im 6/88  bone]
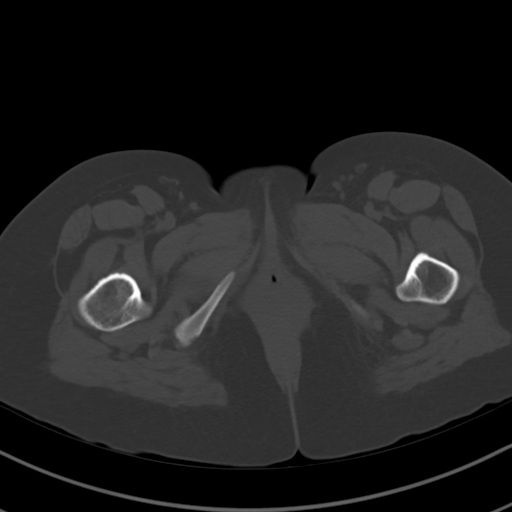
[im 12/88  soft-tissue]
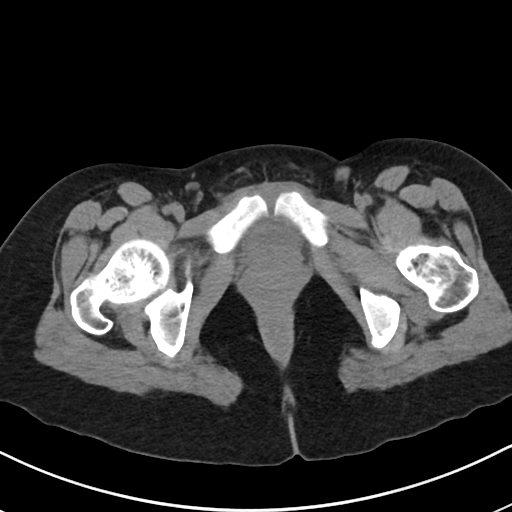
[im 17/88  soft-tissue]
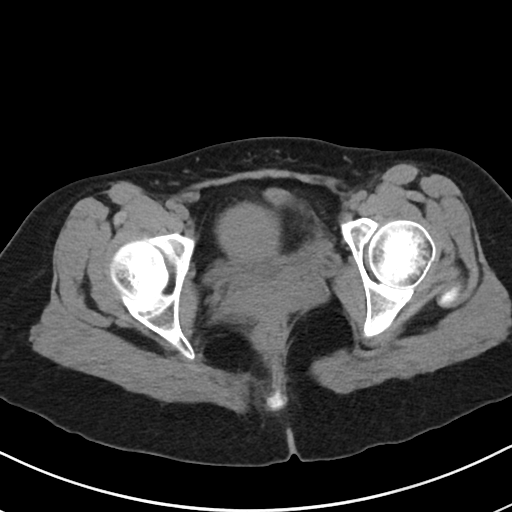
[im 26/88  soft-tissue]
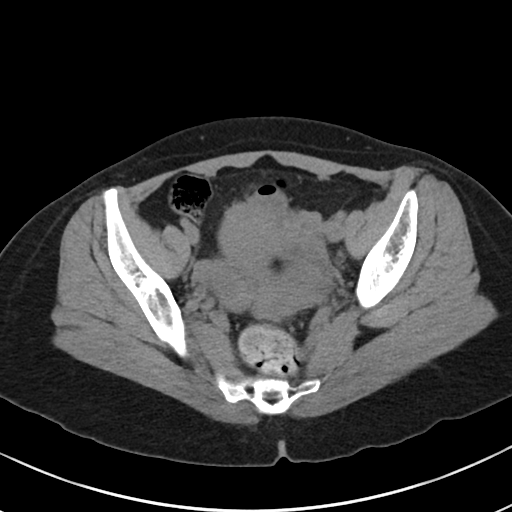
[im 31/88  soft-tissue]
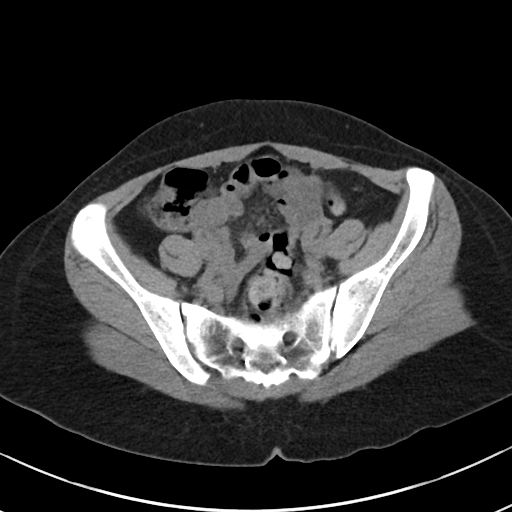
[im 37/88  soft-tissue]
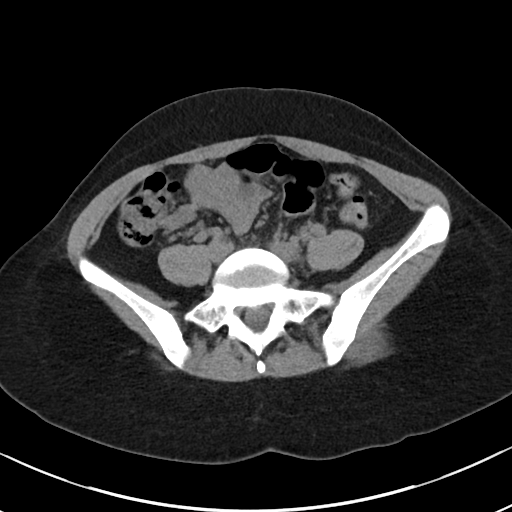
[im 45/88  soft-tissue]
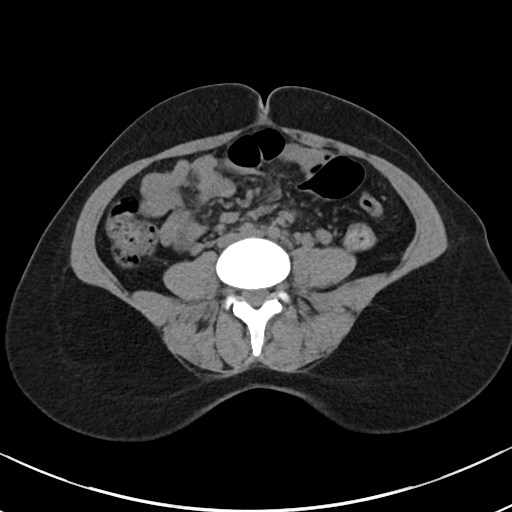
[im 51/88  soft-tissue]
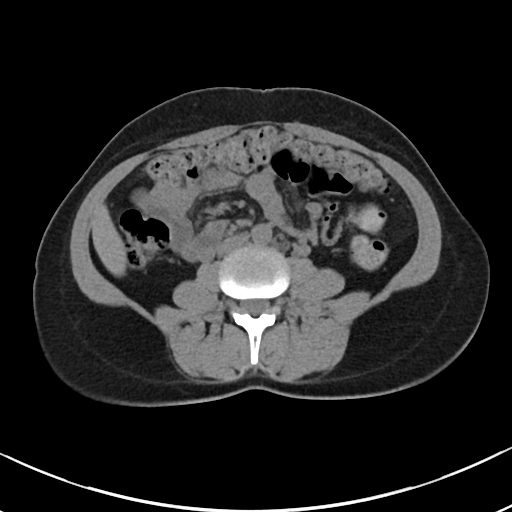
[im 57/88  soft-tissue]
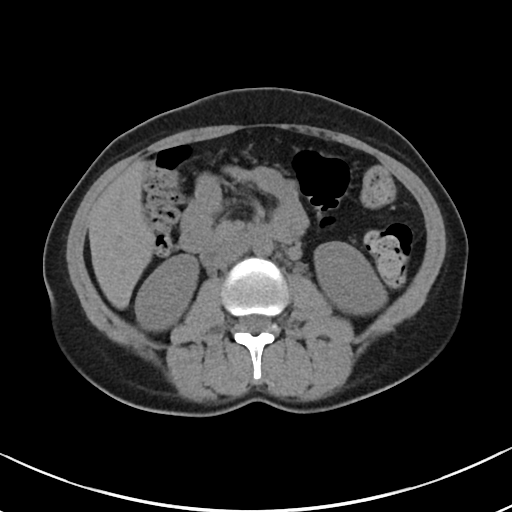
[im 57/88  bone]
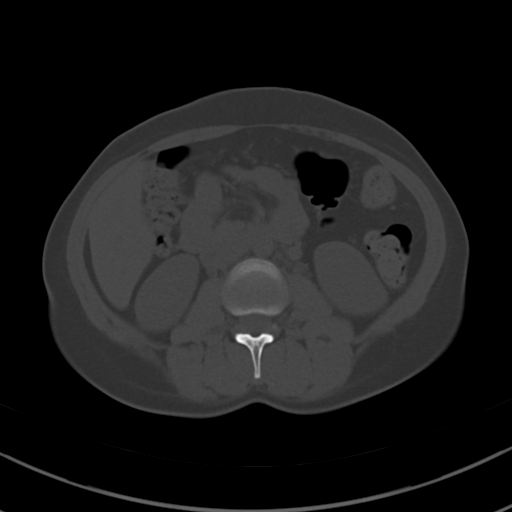
[im 62/88  soft-tissue]
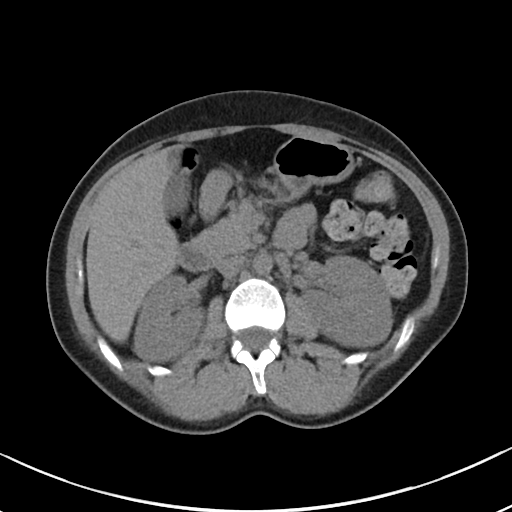
[im 71/88  soft-tissue]
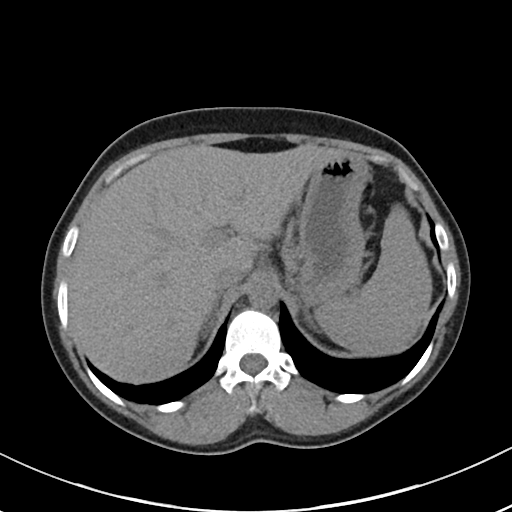
[im 76/88  soft-tissue]
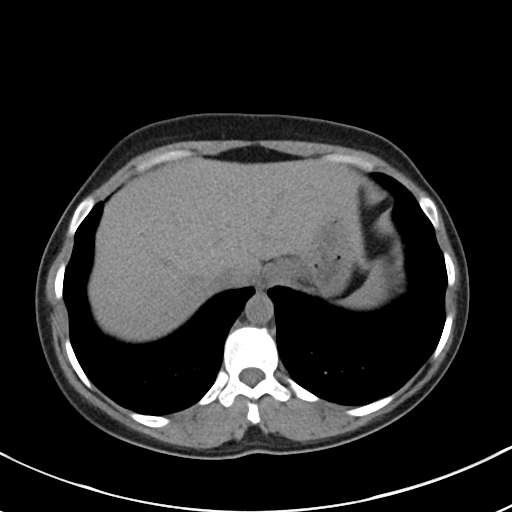
[im 82/88  soft-tissue]
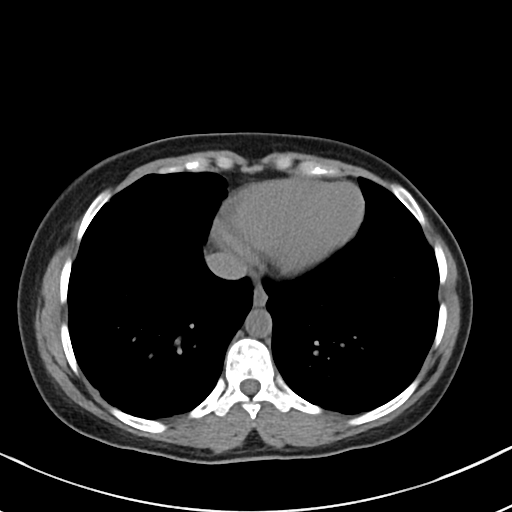

[Series 5: renal stone 3.0 cor · coronal · 0.59mm/px · 3 of 77 slices shown]
[im 26/77  soft-tissue]
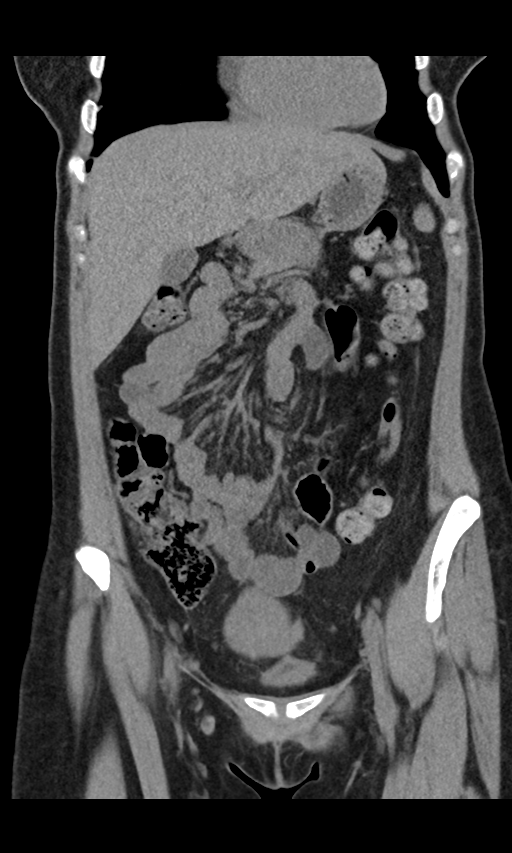
[im 34/77  soft-tissue]
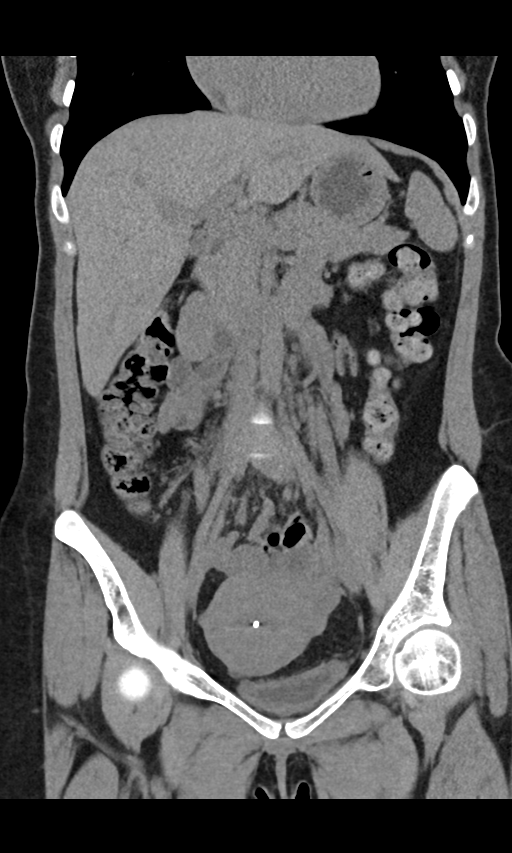
[im 43/77  soft-tissue]
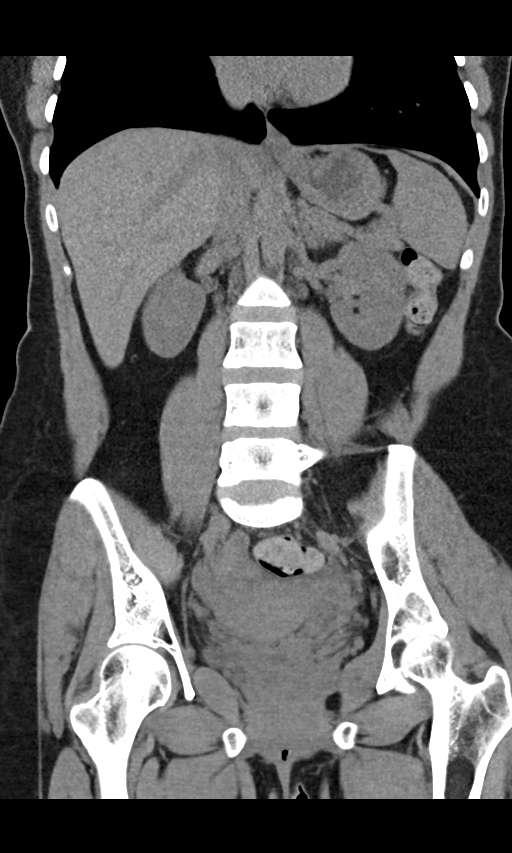

[16 of 46 positions shown; findings below may reference images not displayed]

FINDINGS: Lung bases are free of acute infiltrate or sizable effusion.

The liver, gallbladder, spleen, adrenal glands and pancreas are
within normal limits. The left kidney is well visualized and
demonstrates no renal calculi or obstructive changes. Right kidney
demonstrates no renal calculi or obstructive changes. No ureteral
calculi are seen.

The appendix is well visualized and within normal limits. An IUD is
noted in place. No free pelvic fluid is seen. The bladder is
decompressed. No acute bony abnormality is noted.
IMPRESSION: No acute abnormality is noted

## 2020-02-03 ENCOUNTER — Ambulatory Visit: Payer: Self-pay

## 2020-02-03 ENCOUNTER — Ambulatory Visit: Payer: Medicaid Other | Attending: Internal Medicine

## 2020-02-03 ENCOUNTER — Other Ambulatory Visit: Payer: Self-pay

## 2020-02-03 DIAGNOSIS — Z20822 Contact with and (suspected) exposure to covid-19: Secondary | ICD-10-CM | POA: Insufficient documentation

## 2020-02-04 LAB — NOVEL CORONAVIRUS, NAA: SARS-CoV-2, NAA: NOT DETECTED

## 2020-02-04 LAB — SARS-COV-2, NAA 2 DAY TAT

## 2021-08-08 ENCOUNTER — Other Ambulatory Visit: Payer: Self-pay

## 2021-08-08 ENCOUNTER — Emergency Department (HOSPITAL_COMMUNITY)
Admission: EM | Admit: 2021-08-08 | Discharge: 2021-08-08 | Disposition: A | Discharge status: Home / Self Care | Payer: Medicaid Other | Attending: Emergency Medicine | Admitting: Emergency Medicine

## 2021-08-08 ENCOUNTER — Encounter (HOSPITAL_COMMUNITY): Payer: Self-pay | Admitting: Emergency Medicine

## 2021-08-08 DIAGNOSIS — Z20822 Contact with and (suspected) exposure to covid-19: Secondary | ICD-10-CM | POA: Insufficient documentation

## 2021-08-08 DIAGNOSIS — J101 Influenza due to other identified influenza virus with other respiratory manifestations: Secondary | ICD-10-CM | POA: Insufficient documentation

## 2021-08-08 LAB — RESP PANEL BY RT-PCR (FLU A&B, COVID) ARPGX2
Influenza A by PCR: POSITIVE — AB
Influenza B by PCR: NEGATIVE
SARS Coronavirus 2 by RT PCR: NEGATIVE

## 2021-08-08 NOTE — Discharge Instructions (Signed)
You were diagnosed with influenza A in the emergency department this evening.  This is a viral infection, and therefore antibiotics are not indicated.  Managed with supportive care including Afrin and Mucinex D (orange box) that you can get over-the-counter at your local pharmacy.  Should help relieve your headache which is likely contributing to sinus congestion.  Additionally, make sure you are staying adequately hydrated and managing fevers with Tylenol and ibuprofen.  Return if development of new or worsening symptoms

## 2021-08-08 NOTE — ED Provider Notes (Signed)
Emergency Medicine Provider Triage Evaluation Note  Aviva Wolfer , a 40 y.o. female  was evaluated in triage.  Pt complains of fevers, chills, rhinorrhea, congestion, cough. Son sick with similar sxs.  Review of Systems  Positive: Rhinorrhea, congestion, cough, fever Negative: vomiting  Physical Exam  BP (!) 120/92   Pulse 97   Temp 98.9 F (37.2 C) (Oral)   Resp 20   Ht 5' (1.524 m)   Wt 61.2 kg   SpO2 96%   BMI 26.37 kg/m  Gen:   Awake, no distress   Resp:  Normal effort  MSK:   Moves extremities without difficulty  Other:  Lungs ctab  Medical Decision Making  Medically screening exam initiated at 1:08 PM.  Appropriate orders placed.  Joshlynn Alfonzo was informed that the remainder of the evaluation will be completed by another provider, this initial triage assessment does not replace that evaluation, and the importance of remaining in the ED until their evaluation is complete.     Rodney Booze, PA-C 08/08/21 1309    Elnora Morrison, MD 08/09/21 1118

## 2021-08-08 NOTE — ED Provider Notes (Signed)
[facility] EMERGENCY DEPARTMENT Provider Note   CSN: [id number] Arrival date & time: 08/08/21  1121     History Chief Complaint  Patient presents with   Headache    Karen Berry is a 40 y.o. female.  Patient with no significant past medical history presents today with chief complaint of cough and congestion.  She states that her symptoms have been going on for 4 days and that her son has been sick with similar symptoms.  She has tried over-the-counter's cough syrup without relief.  She has also been taking Tylenol and ibuprofen.  She also endorses a headache that is located "in the bones of my face."  She states she is very congested and is unable to breathe out of her nose and endorses additional pressure in both of her ears.  She denies shortness of breath, difficulty swallowing, nausea, vomiting, or diarrhea.  The history is provided by the patient and a relative. No language interpreter was used.  Headache Associated symptoms: cough, drainage, ear pain and fever   Associated symptoms: no congestion, no diarrhea, no dizziness, no eye pain, no nausea, no neck pain, no neck stiffness, no numbness, no seizures, no sore throat, no vomiting and no weakness       Past Medical History:  Diagnosis Date   Anemia    Thyroid disease     Patient Active Problem List   Diagnosis Date Noted   Loss of weight 02/02/2013   Bacteremia 02/02/2013   Pyelonephritis 01/31/2013   IUD (intrauterine device) in place 08/21/2011    Past Surgical History:  Procedure Laterality Date   female circumcision     NO PAST SURGERIES       OB History     Gravida  5   Para  5   Term  5   Preterm  0   AB  0   Living  6      SAB  0   IAB  0   Ectopic  0   Multiple  2   Live Births  1           No family history on file.  Social History   Tobacco Use   Smoking status: Never  Substance Use Topics   Alcohol use: No   Drug use: No    Home  Medications Prior to Admission medications   Medication Sig Start Date End Date Taking? Authorizing Provider  cephALEXin (KEFLEX) 500 MG capsule 2 caps po bid x 7 days 11/14/13   Riki Altes, MD  cetirizine (ZYRTEC) 10 MG tablet Take 1 tablet (10 mg total) by mouth daily. 09/23/15   Gloriann Loan, PA-C  cyclobenzaprine (FLEXERIL) 10 MG tablet Take 1 tablet (10 mg total) by mouth 2 (two) times daily as needed for muscle spasms. 12/10/15   Shary Decamp, PA-C  ibuprofen (ADVIL,MOTRIN) 800 MG tablet Take 1 tablet (800 mg total) by mouth 3 (three) times daily. 09/23/15   Gloriann Loan, PA-C  ondansetron (ZOFRAN ODT) 8 MG disintegrating tablet 8mg  ODT q4 hours prn nausea 11/14/13   Riki Altes, MD    Allergies    Patient has no known allergies.  Review of Systems   Review of Systems  Constitutional:  Positive for fever. Negative for chills.  HENT:  Positive for ear pain, postnasal drip and rhinorrhea. Negative for congestion, ear discharge, sore throat, trouble swallowing and voice change.   Eyes:  Negative for pain.  Respiratory:  Positive for cough. Negative for  apnea, choking, chest tightness, shortness of breath, wheezing and stridor.   Gastrointestinal:  Negative for diarrhea, nausea and vomiting.  Genitourinary:  Negative for dysuria.  Musculoskeletal:  Negative for neck pain and neck stiffness.  Skin:  Negative for rash.  Neurological:  Positive for headaches. Negative for dizziness, tremors, seizures, syncope, facial asymmetry, speech difficulty, weakness, light-headedness and numbness.  Psychiatric/Behavioral:  Negative for confusion and decreased concentration.   All other systems reviewed and are negative.  Physical Exam Updated Vital Signs BP 117/84   Pulse 94   Temp 98.7 F (37.1 C) (Oral)   Resp 18   Ht 5' (1.524 m)   Wt 61.2 kg   SpO2 98%   BMI 26.37 kg/m   Physical Exam Vitals and nursing note reviewed.  Constitutional:      General: She is not in acute distress.     Appearance: Normal appearance. She is well-developed and normal weight. She is not ill-appearing, toxic-appearing or diaphoretic.     Comments: Patient sitting comfortably in chair in no distress  HENT:     Head: Normocephalic and atraumatic.     Comments: Palpable pain over bilateral frontal and maxillary sinuses    Right Ear: Tympanic membrane, ear canal and external ear normal.     Left Ear: Tympanic membrane, ear canal and external ear normal.     Nose: Nose normal.     Mouth/Throat:     Mouth: Mucous membranes are moist.  Eyes:     Extraocular Movements: Extraocular movements intact.     Pupils: Pupils are equal, round, and reactive to light.  Cardiovascular:     Rate and Rhythm: Normal rate and regular rhythm.     Heart sounds: Normal heart sounds.  Pulmonary:     Effort: Pulmonary effort is normal. No respiratory distress.     Breath sounds: Normal breath sounds.  Abdominal:     General: Abdomen is flat.     Palpations: Abdomen is soft.  Musculoskeletal:        General: Normal range of motion.     Cervical back: Normal range of motion and neck supple.  Skin:    General: Skin is warm and dry.  Neurological:     General: No focal deficit present.     Mental Status: She is alert.     GCS: GCS eye subscore is 4. GCS verbal subscore is 5. GCS motor subscore is 6.  Psychiatric:        Mood and Affect: Mood normal.        Behavior: Behavior normal.    ED Results / Procedures / Treatments   Labs (all labs ordered are listed, but only abnormal results are displayed) Labs Reviewed  RESP PANEL BY RT-PCR (FLU A&B, COVID) ARPGX2 - Abnormal; Notable for the following components:      Result Value   Influenza A by PCR POSITIVE (*)    All other components within normal limits    EKG None  Radiology No results found.  Procedures Procedures   Medications Ordered in ED Medications - No data to display  ED Course  I have reviewed the triage vital signs and the nursing  notes.  Pertinent labs & imaging results that were available during my care of the patient were reviewed by me and considered in my medical decision making (see chart for details).    MDM Rules/Calculators/A&P  Patient positive for influenza A following 4 days of upper respiratory infection symptoms.  Her lungs are clear to auscultation in all fields, therefore further imaging is not warranted.  She is afebrile, nontoxic-appearing, and in no acute distress speaking in complete sentences.  Her headache is reproducible on palpation of sinuses, therefore suspect that her symptoms are related to pressure in her sinus cavity. Discussed that antibiotics are not indicated for viral infections. Pt will be discharged with symptomatic treatment and supportive care such as plenty of oral hydration, Tylenol/ibuprofen for fevers, and over-the-counter cold medications.  Verbalizes understanding and is agreeable with plan. Pt is hemodynamically stable & in NAD prior to dc.  Final Clinical Impression(s) / ED Diagnoses Final diagnoses:  Influenza A    Rx / DC Orders ED Discharge Orders     None     An After Visit Summary was printed and given to the patient.    Nestor Lewandowsky 08/08/21 1630    Gareth Morgan, MD 08/09/21 [phone]

## 2021-08-08 NOTE — ED Triage Notes (Signed)
Patient arrives with son c/o fever, headache and runny nose x 3-4 days. Reports one kid in house with similar symptoms.

## 2022-08-19 ENCOUNTER — Other Ambulatory Visit: Payer: Self-pay | Admitting: Obstetrics & Gynecology

## 2022-08-19 DIAGNOSIS — Z1231 Encounter for screening mammogram for malignant neoplasm of breast: Secondary | ICD-10-CM

## 2022-10-03 ENCOUNTER — Other Ambulatory Visit: Payer: Self-pay | Admitting: Hematology and Oncology

## 2022-10-03 ENCOUNTER — Telehealth: Payer: Self-pay

## 2022-10-03 ENCOUNTER — Ambulatory Visit
Admission: RE | Admit: 2022-10-03 | Discharge: 2022-10-03 | Disposition: A | Discharge status: Home / Self Care | Payer: Medicaid Other | Source: Ambulatory Visit | Attending: Obstetrics & Gynecology | Admitting: Obstetrics & Gynecology

## 2022-10-03 DIAGNOSIS — N6489 Other specified disorders of breast: Secondary | ICD-10-CM

## 2022-10-03 DIAGNOSIS — Z1231 Encounter for screening mammogram for malignant neoplasm of breast: Secondary | ICD-10-CM

## 2022-10-10 ENCOUNTER — Ambulatory Visit
Admission: RE | Admit: 2022-10-10 | Discharge: 2022-10-10 | Disposition: A | Discharge status: Home / Self Care | Payer: No Typology Code available for payment source | Source: Ambulatory Visit | Attending: Hematology and Oncology | Admitting: Hematology and Oncology

## 2022-10-10 ENCOUNTER — Other Ambulatory Visit: Payer: Self-pay | Admitting: Hematology and Oncology

## 2022-10-10 ENCOUNTER — Ambulatory Visit
Admission: RE | Admit: 2022-10-10 | Discharge: 2022-10-10 | Disposition: A | Discharge status: Home / Self Care | Payer: Medicaid Other | Source: Ambulatory Visit | Attending: Hematology and Oncology | Admitting: Hematology and Oncology

## 2022-10-10 ENCOUNTER — Ambulatory Visit: Payer: Self-pay | Admitting: Hematology and Oncology

## 2022-10-10 VITALS — BP 113/48 | Wt 139.0 lb

## 2022-10-10 DIAGNOSIS — N6489 Other specified disorders of breast: Secondary | ICD-10-CM

## 2022-10-10 DIAGNOSIS — N6310 Unspecified lump in the right breast, unspecified quadrant: Secondary | ICD-10-CM

## 2022-10-10 DIAGNOSIS — N6320 Unspecified lump in the left breast, unspecified quadrant: Secondary | ICD-10-CM

## 2022-10-10 MED ORDER — CEPHALEXIN 500 MG PO CAPS: 500.0000 mg | ORAL_CAPSULE | Freq: Two times a day (BID) | ORAL | 0 refills | Status: DC

## 2022-10-10 NOTE — Patient Instructions (Signed)
[place] about breast self awareness and gave educational materials to take home. Patient did not need a Pap smear today due to last Pap smear was in 2023 per patient. Let her know [facility] will cover Pap smears every 5 years unless has a history of abnormal Pap smears. Referred patient to the [facility] for diagnostic mammogram. Appointment scheduled for 10/10/22. Patient aware of appointment and will be there. Let patient know will follow up with her within the next couple weeks with results. Tu Sheppard verbalized understanding.  Melodye Ped, NP 11:19 AM

## 2022-10-10 NOTE — Progress Notes (Signed)
Karen Berry is a 42 y.o. female who presents to [facility] clinic today with complaint of right breast lesion.    Pap Smear: Pap not smear completed today. Last Pap smear was 2023 and was normal. Per patient has no history of an abnormal Pap smear. Last Pap smear result is not available in Epic.   Physical exam: Breasts Breasts symmetrical. No skin abnormalities bilateral breasts. No nipple retraction bilateral breasts. No nipple discharge bilateral breasts. No lymphadenopathy. No lumps palpated bilateral breasts. Right breast noted with dime sized lesion red and warm to touch located upper inner quadrant.      Pelvic/Bimanual Pap is not indicated today    Smoking History: Patient has never smoked and was not referred to quit line.    Patient Navigation: Patient education provided. Access to services provided for patient through [place] interpreter provided. No transportation provided   Colorectal Cancer Screening: Per patient has never had colonoscopy completed No complaints today.    Breast and Cervical Cancer Risk Assessment: Patient does not have family history of breast cancer, known genetic mutations, or radiation treatment to the chest before age 27. Patient does not have history of cervical dysplasia, immunocompromised, or DES exposure in-utero.  Risk Scores as of 10/10/2022     Karen Berry           5-year 0.53 %   Lifetime 7.37 %            Last calculated by Karen Berry, CMA on 10/10/2022 at 11:05 AM        A: [facility] exam without pap smear Complaint of right breast lesion, noted at upper inner breast. Dime-sized lesion red and warm to touch. Will send in antibiotics; Keflex x 1 week.   P: Referred patient to the [facility] for a diagnostic mammogram. Appointment scheduled 10/10/22.  Melodye Ped, NP 10/10/2022 11:14 AM

## 2022-10-16 ENCOUNTER — Ambulatory Visit
Admission: RE | Admit: 2022-10-16 | Discharge: 2022-10-16 | Disposition: A | Discharge status: Home / Self Care | Payer: No Typology Code available for payment source | Source: Ambulatory Visit | Attending: Hematology and Oncology | Admitting: Hematology and Oncology

## 2022-10-16 DIAGNOSIS — N6310 Unspecified lump in the right breast, unspecified quadrant: Secondary | ICD-10-CM

## 2022-10-16 HISTORY — PX: BREAST BIOPSY: SHX20

## 2022-10-22 ENCOUNTER — Ambulatory Visit: Payer: Self-pay | Admitting: Hematology and Oncology

## 2022-10-22 VITALS — BP 128/82 | Wt 151.0 lb

## 2022-10-22 DIAGNOSIS — C801 Malignant (primary) neoplasm, unspecified: Secondary | ICD-10-CM

## 2022-10-22 NOTE — Progress Notes (Signed)
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 Discussed with patient the need for further diagnostics including surgical removal of breast tumor. She understands that she has an appointment with Dr. Donnie Mesa on February 20th at 3:40pm. We discussed that treatment planning will be decided upon final surgical pathology. She also understands that she may require additional imaging to assess for spread of disease. We discussed that treatment planning could include chemotherapy/ immunotherapy and/or radiation therapy, again depending upon final diagnosis. I encouraged her to bring someone for support at her surgery appointment. She verbalizes understanding of discussion today.

## 2022-10-23 ENCOUNTER — Telehealth: Payer: Self-pay

## 2022-10-23 NOTE — Telephone Encounter (Signed)
Via Deatra Canter # [phone] @ [place], Patient was contacted to verify citizenship to determine if she is eligible to apply for [facility] Medicaid. Patient stated that she was not a [place] Citizen,was informed will mail [place] application, verbalized understanding.

## 2022-10-29 ENCOUNTER — Ambulatory Visit: Payer: Self-pay | Admitting: Surgery

## 2022-10-29 ENCOUNTER — Other Ambulatory Visit: Payer: Self-pay | Admitting: Surgery

## 2022-10-29 DIAGNOSIS — C493 Malignant neoplasm of connective and soft tissue of thorax: Secondary | ICD-10-CM

## 2022-10-29 NOTE — H&P (Signed)
Subjective    Chief Complaint: New Consultation (New Onset-Rt Breast)  [facility] Arabic phone interpreter was used.   History of Present Illness: Karen Berry is a 42 y.o. female who is seen today as an office consultation at the request of Dr. Sheryle Hail for evaluation of New Consultation (New Onset-Rt Breast) .     This is a 42 year old female from [place] who presents with a small skin lesion in the right upper outer quadrant of her breast for at least a year.  Initially this area was nonpigmented.  Over the last 4 months it has grown in size and has become dark purple.  It has also become painful.  About 4 months ago she also noticed a palpable mass underneath the skin lesion.   She sought medical attention for this area and was first seen on 10/10/2022 by the nurse practitioner for the [facility] program.  Imaging was done urgently that revealed a 2.2 x 1.3 x 1.7 cm hypoechoic mass located at 11:00 in the right breast 10 cm from the nipple.  This extends into the skin.  There is internal vascularity.  Axillary ultrasound showed normal lymph nodes.  She underwent biopsy on 10/16/2022.  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  The report stated that additional immunohistochemistry would be performed and reported as an addendum.  At this point, no addendum has been seen in the patient's record.   The patient has no family history of breast cancer.  She has 6 children.  The youngest is 28.     Review of Systems: A complete review of systems was obtained from the patient.  I have reviewed this information and discussed as appropriate with the patient.  See HPI as well for other ROS.   Review of Systems  Constitutional: Negative.   HENT: Negative.    Eyes: Negative.   Respiratory: Negative.    Cardiovascular: Negative.   Gastrointestinal: Negative.   Genitourinary: Negative.   Musculoskeletal:  Negative.   Skin: Negative.   Neurological: Negative.   Endo/Heme/Allergies: Negative.   Psychiatric/Behavioral: Negative.          Medical History: Past Medical History      Past Medical History:  Diagnosis Date   Anemia     Thyroid disease             Patient Active Problem List  Diagnosis   Sarcoma of chest wall (CMS-HCC)      Past Surgical History  History reviewed. No pertinent surgical history.       Allergies  No Known Allergies     No current outpatient medications on file prior to visit.    No current facility-administered medications on file prior to visit.      Family History  History reviewed. No pertinent family history.      Social History       Tobacco Use  Smoking Status Never  Smokeless Tobacco Never      Social History  Social History        Socioeconomic History   Marital status: Married  Tobacco Use   Smoking status: Never   Smokeless tobacco: Never  Vaping Use   Vaping Use: Never used  Substance and Sexual Activity   Alcohol use: Never   Drug use: Never        Objective:         Vitals:    10/29/22 1621  BP: 120/70  Pulse: 81  Temp: 36.8 C (98.2 F)  SpO2: 96%  Weight: 67.1 kg (148 lb)  Height: 162.6 cm (5' 4"$ )  PainSc: 0-No pain    Body mass index is 25.4 kg/m.   Physical Exam    Constitutional:  WDWN in NAD, conversant, no obvious deformities; lying in bed comfortably Eyes:  Pupils equal, round; sclera anicteric; moist conjunctiva; no lid lag HENT:  Oral mucosa moist; good dentition  Neck:  No masses palpated, trachea midline; no thyromegaly Lungs:  CTA bilaterally; normal respiratory effort Breasts:  symmetric, no nipple changes; no palpable masses or lymphadenopathy on left side;    The right breast shows no nipple changes.  No palpable masses within the breast tissue itself with the exception of the extreme upper outer border of the breast where it meets the chest wall just anterior to the axilla.   There is a 1 cm dark purple pigmented area with a firm 2 cm mass underneath.  No drainage from this area.  No palpable lymphadenopathy in the adjacent right axilla.  The mass is firm and fairly well-demarcated. CV:  Regular rate and rhythm; no murmurs; extremities well-perfused with no edema Abd:  +bowel sounds, soft, non-tender, no palpable organomegaly; no palpable hernias Musc:  Unable to assess gait; no apparent clubbing or cyanosis in extremities Lymphatic:  No palpable cervical or axillary lymphadenopathy Skin:  Warm, dry; no sign of jaundice Psychiatric - alert and oriented x 4; calm mood and affect     Labs, Imaging and Diagnostic Testing: Diagnosis Breast, right, needle core biopsy, 11:00 10 cmfn POORLY DIFFERENTIATED MALIGNANCY. SEE NOTE. Diagnosis Note The neoplasm is characterized by sheets of cells with moderate to abundant eosinophilic cytoplasm, marked nuclear atypia and scattered mitotic figures. Immunohistochemistry is negative with cytokeratin AE1/AE3, cytokeratin 7, GATA3, Melan-A and [id number]. Additional immunohistochemistry will be performed and reported as an addendum. The differential based on the morphologic features includes metaplastic carcinoma and high-grade sarcoma. Dr. Donneta Romberg agrees. [facility] was notified on 10/17/2022. Claudette Laws MD Pathologist, Electronic Signature (Case signed 10/18/2022)   CLINICAL DATA:  42 year old female presenting for evaluation of an enlarging right breast mass. Patient now has some associated tenderness.   EXAM: DIGITAL DIAGNOSTIC BILATERAL MAMMOGRAM WITH TOMOSYNTHESIS; ULTRASOUND RIGHT BREAST LIMITED   TECHNIQUE: Bilateral digital diagnostic mammography and breast tomosynthesis was performed.; Targeted ultrasound examination of the right breast was performed   COMPARISON:  None available.   ACR Breast Density Category b: There are scattered areas of fibroglandular density.   FINDINGS: Mammogram:    Right breast: A skin BB marks the palpable site of concern reported by the patient in the upper outer right breast. A spot tangential view of this area was performed in addition to standard views. At the palpable site there is an oval circumscribed mass measuring approximately 2.5 cm which appears to extend into the skin. There are no additional suspicious findings in the right breast.   Left breast: No suspicious mass, distortion, or microcalcifications are identified to suggest presence of malignancy.   On physical exam at the site of concern reported by the patient in the upper outer right breast there is a reddish purple skin nodule.   Ultrasound:   Targeted ultrasound performed at the palpable site of concern at 11 o'clock 10 cm from the nipple demonstrating a superficial oval circumscribed hypoechoic mass measuring 2.2 x 1.3 x 1.7 cm, with extension into the skin. There is internal vascularity.   Targeted ultrasound of the right  axilla demonstrates normal lymph nodes.   IMPRESSION: Indeterminate subdermal mass in the right breast at 11 o'clock measuring 2.2 cm.   RECOMMENDATION: Ultrasound-guided core needle biopsy x1 of the right breast.   I have discussed the findings and recommendations with the patient who agrees to proceed with biopsy. The patient will be scheduled for the biopsy appointment prior to leaving the office today.   BI-RADS CATEGORY  4: Suspicious.     Electronically Signed   By: Audie Pinto M.D.   On: 10/10/2022 16:16   Assessment and Plan:  Diagnoses and all orders for this visit:   Sarcoma of chest wall (CMS-HCC) -     Ambulatory Referral to [facility] -     Ambulatory Referral to Oncology-Medical -     Ambulatory Referral to Radiation Oncology       At this point, the pathology report is rather vague.  We will send the tissue to [facility] for a definitive second opinion. We will obtain a MRI of the breast to  further evaluate both breast and both axilla. Referrals to oncology, radiation oncology, and genetics. If it appears that this mass is localized to the area directly underneath the skin lesion, she will likely need a generous lumpectomy including the overlying skin.  At this point this does not appear to be atypical malignancy within the breast tissue itself.  It may be a soft tissue sarcoma in the subcutaneous tissue just adjacent to the upper edge of her breast.  I spent a significant amount of time with the patient and her husband via interpreter discussing her case and the recommended plan prior to planning surgery.  We will try to expedite all of the studies and referrals.   Return in about 2 weeks (around 11/12/2022).   Nga Rabon Jearld Adjutant, MD  10/29/2022 5:13 PM

## 2022-11-01 ENCOUNTER — Telehealth: Payer: Self-pay | Admitting: Hematology and Oncology

## 2022-11-01 ENCOUNTER — Telehealth: Payer: Self-pay | Admitting: Radiation Oncology

## 2022-11-01 NOTE — Telephone Encounter (Signed)
Scheduled appt per 2/22 referrals. Pt is aware of appt date and time. Pt is aware to arrive 15 mins prior to appt time and to bring and updated insurance card. Pt is aware of appt location.

## 2022-11-01 NOTE — Telephone Encounter (Signed)
LVM via interpreter to scheduled CON with Dr. Isidore Moos.

## 2022-11-04 ENCOUNTER — Other Ambulatory Visit: Payer: Self-pay

## 2022-11-04 ENCOUNTER — Encounter: Payer: Self-pay | Admitting: Licensed Clinical Social Worker

## 2022-11-04 ENCOUNTER — Inpatient Hospital Stay: Payer: Medicaid Other | Attending: Hematology and Oncology | Admitting: Licensed Clinical Social Worker

## 2022-11-04 ENCOUNTER — Inpatient Hospital Stay: Payer: Medicaid Other

## 2022-11-04 DIAGNOSIS — C493 Malignant neoplasm of connective and soft tissue of thorax: Secondary | ICD-10-CM

## 2022-11-04 DIAGNOSIS — Z793 Long term (current) use of hormonal contraceptives: Secondary | ICD-10-CM | POA: Insufficient documentation

## 2022-11-04 DIAGNOSIS — C50411 Malignant neoplasm of upper-outer quadrant of right female breast: Secondary | ICD-10-CM | POA: Insufficient documentation

## 2022-11-04 NOTE — Progress Notes (Addendum)
REFERRING PROVIDER: Donnie Mesa, MD 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 Aldava, a 42 y.o. female, was seen for a [place] cancer genetics consultation at the request of Dr. Georgette Dover due to a personal history of cancer.  Ms. Regalbuto presents to clinic today to discuss the possibility of a hereditary predisposition to cancer, genetic testing, and to further clarify her future cancer risks, as well as potential cancer risks for family members.   CANCER HISTORY:  In 2024, at the age of 20, Ms. Byrom underwent right breast biopsy. Pathology showed poorly differentiated malignancy with a differential of metaplastic carcinoma vs high grade sarcoma. The plan currently includes sending tissue to [facility] for second opinion and MRI.   RISK FACTORS:  Menarche was at age 2.  First live birth at age 67-20.  Ovaries intact: yes.  Hysterectomy: no.  Colonoscopy: no  Past Medical History:  Diagnosis Date   Anemia    Thyroid disease     Past Surgical History:  Procedure Laterality Date   BREAST BIOPSY Right 10/16/2022   [place] RT BREAST BX W LOC DEV 1ST LESION IMG BX SPEC US GUIDE 10/16/2022 [facility] MAMMOGRAPHY   female circumcision     NO PAST SURGERIES      FAMILY HISTORY:  We obtained a detailed, 4-generation family history.  Significant diagnoses are listed below: No family history on file.  Ms. Boeckmann has 6 sisters, 2 brothers, 4 sons and 2 daughters. None have had cancer. One of her sons had a benign brain tumor removed recently. No known family history of cancer.  Ms. Corrie is unaware of previous family history of genetic testing for hereditary cancer risks. There is no reported Ashkenazi Jewish ancestry. There is no known consanguinity.    GENETIC COUNSELING ASSESSMENT: Ms. Freise is a 42 y.o. female with a personal  history which is somewhat suggestive of a hereditary cancer syndrome and predisposition to cancer. We, therefore, discussed and recommended the following at today's visit.   DISCUSSION: We discussed that approximately 10% of breast cancer is hereditary. Most cases of hereditary breast cancer are associated with BRCA1/BRCA2 genes, although there are other genes associated with hereditary  cancer as well. Cancers and risks are gene specific. We discussed that testing is beneficial for several reasons including knowing about cancer risks, identifying potential screening and risk-reduction options that may be appropriate, and to understand if other family members could be at risk for cancer and allow them to undergo genetic testing.   We reviewed the characteristics, features and inheritance patterns of hereditary cancer syndromes. We also discussed genetic testing, including the appropriate family members to test, the process of testing, insurance coverage and turn-around-time for results. We discussed the implications of a negative, positive and/or variant of uncertain significant result. We recommended Ms. Niehoff pursue genetic testing for the [facility] Breast Cancer STAT + Multi-Cancer+RNA gene panel.   Based on Ms. Eggleton's personal history of cancer, she meets medical criteria for genetic testing. Despite that she meets criteria, she may still have an out of pocket cost. We discussed that if her out of pocket cost for testing is over $100, the laboratory will call and confirm whether she wants to proceed with testing.  If the out of pocket cost of testing is less than $100 she  will be billed by the genetic testing laboratory.   PLAN: After considering the risks, benefits, and limitations, Ms. Paganelli provided informed consent to pursue genetic testing and the blood sample was sent to [facility] for analysis of the Hereditary Breast Cancer STAT + Multi-Cancer+RNA panel. Results should be available  within approximately 2-3 weeks' time, at which point they will be disclosed by telephone to Ms. Goughnour, as will any additional recommendations warranted by these results. Ms. Bertolet will receive a summary of her genetic counseling visit and a copy of her results once available. This information will also be available in Epic.   Ms. Piechocki questions were answered to her satisfaction today. Our contact information was provided should additional questions or concerns arise. Thank you for the referral and allowing [place] to share in the care of your patient.   Faith Rogue, MS, [facility] Genetic Counselor [email or web address] Phone: [phone]  The patient was seen for a total of 40 minutes in face-to-face genetic counseling.  Patient was seen with her son and Arabic interpreter via phone. Dr. Grayland Ormond was available for discussion regarding this case.   _______________________________________________________________________ For Office Staff:  Number of people involved in session: 4 Was an Intern/ student involved with case: yes; [employer] intern Marcello Moores was present and assisted with this case.

## 2022-11-04 NOTE — Progress Notes (Signed)
Radiation Oncology         [phone] ________________________________  Initial Outpatient Consultation  Name: Karen Berry MRN: [id number]  Date: 11/05/2022  DOB: 12-27-1980  [id number], Sami, MD  Donnie Mesa, MD   REFERRING PHYSICIAN: Donnie Mesa, MD  DIAGNOSIS: No diagnosis found.   Cancer Staging  No matching staging information was found for the patient.  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   CHIEF COMPLAINT: Here to discuss management of suspected right breast cancer  HISTORY OF PRESENT ILLNESS::Karen Berry is a 42 y.o. female who presented with an enlarging palpable right breast mass x 4 months, as well as a painful small skin lesion in the upper outer right breast x 1 year, correlating with the area above the mass. This prompted a bilateral diagnostic mammogram and right breast ultrasound on 10/10/22 which demonstrated a superficial oval mass with interval vascularity in the 11 o'clock right breast, 10 cmfn, measuring 2.2 x 1.3 x 1.7 cm, with extension into the skin. No evidence of right axillary lymphadenopathy was appreciated.    Biopsy of the 11 o'clock right breast on date of 10/16/22 showed findings suggestive of a poorly differentiated malignancy, with differential considerations including metaplastic carcinoma versus high-grade sarcoma. No lymph nodes were examined. The path report stated that prognostics and additional IHC would be performed and reported in an addendum. For some reason, this has not yet been performed.   Accordingly, the patient was referred to Dr. Georgette Dover on 10/29/22 to discuss treatment options. With regards to her vague pathology report, Dr. Georgette Dover has reached out to [facility] for a second opinion. In terms of surgical options, Dr. Georgette Dover notes that she will likely require a generous lumpectomy which will include excision over the overlying  skin (lesion). In the mean time, Dr. Georgette Dover recommends obtaining a bilateral breast MRI to further evaluate both breasts and both axillas. (MRI is scheduled for 11/08/22).  The patient also met with genetics yesterday and has opted to purse genetic testing. Results are pending at this time.    ***  PREVIOUS RADIATION THERAPY: No  PAST MEDICAL HISTORY:  has a past medical history of Anemia and Thyroid disease.    PAST SURGICAL HISTORY: Past Surgical History:  Procedure Laterality Date   BREAST BIOPSY Right 10/16/2022   [place] RT BREAST BX W LOC DEV 1ST LESION IMG BX SPEC US GUIDE 10/16/2022 [facility] MAMMOGRAPHY   female circumcision     NO PAST SURGERIES      FAMILY HISTORY: family history is not on file.  SOCIAL HISTORY:  reports that she has never smoked. She has never used smokeless tobacco. She reports that she does not drink alcohol and does not use drugs.  ALLERGIES: Patient has no known allergies.  MEDICATIONS:  Current Outpatient Medications  Medication Sig Dispense Refill   cephALEXin (KEFLEX) 500 MG capsule Take 1 capsule (500 mg total) by mouth 2 (two) times daily. 14 capsule 0   cetirizine (ZYRTEC) 10 MG tablet Take 1 tablet (10 mg total) by mouth daily. (Patient not taking: Reported on 10/10/2022) 30 tablet 0   cyclobenzaprine (FLEXERIL) 10 MG tablet Take 1 tablet (10 mg total) by mouth 2 (two) times daily as needed for muscle spasms. (Patient not taking: Reported on 10/10/2022) 20 tablet 0   ibuprofen (ADVIL,MOTRIN) 800 MG tablet Take 1 tablet (800 mg total) by mouth 3 (three) times daily. 21 tablet 0  ondansetron (ZOFRAN ODT) 8 MG disintegrating tablet '8mg'$  ODT q4 hours prn nausea (Patient not taking: Reported on 10/10/2022) 4 tablet 0   No current facility-administered medications for this encounter.    REVIEW OF SYSTEMS: As above in HPI.   PHYSICAL EXAM:  vitals were not taken for this visit.   General: Alert and oriented, in no acute distress HEENT: Head is normocephalic.  Extraocular movements are intact. Oropharynx is clear. Neck: Neck is supple, no palpable cervical or supraclavicular lymphadenopathy. Heart: Regular in rate and rhythm with no murmurs, rubs, or gallops. Chest: Clear to auscultation bilaterally, with no rhonchi, wheezes, or rales. Abdomen: Soft, nontender, nondistended, with no rigidity or guarding. Extremities: No cyanosis or edema. Lymphatics: see Neck Exam Skin: No concerning lesions. Musculoskeletal: symmetric strength and muscle tone throughout. Neurologic: Cranial nerves II through XII are grossly intact. No obvious focalities. Speech is fluent. Coordination is intact. Psychiatric: Judgment and insight are intact. Affect is appropriate. Breasts: *** . No other palpable masses appreciated in the breasts or axillae *** .    ECOG = ***  0 - Asymptomatic (Fully active, able to carry on all predisease activities without restriction)  1 - Symptomatic but completely ambulatory (Restricted in physically strenuous activity but ambulatory and able to carry out work of a light or sedentary nature. For example, light housework, office work)  2 - Symptomatic, <50% in bed during the day (Ambulatory and capable of all self care but unable to carry out any work activities. Up and about more than 50% of waking hours)  3 - Symptomatic, >50% in bed, but not bedbound (Capable of only limited self-care, confined to bed or chair 50% or more of waking hours)  4 - Bedbound (Completely disabled. Cannot carry on any self-care. Totally confined to bed or chair)  5 - Death   Eustace Pen MM, Creech RH, Tormey DC, et al. [phone]). "Toxicity and response criteria of the [facility] Group". [place] Oncol. 5 (6): 649-55   LABORATORY DATA:  Lab Results  Component Value Date   WBC 6.9 12/10/2015   HGB 14.0 12/10/2015   HCT 42.6 12/10/2015   MCV 86.1 12/10/2015   PLT 260 12/10/2015   CMP     Component Value Date/Time   NA 136 12/10/2015  1501   K 3.8 12/10/2015 1501   CL 105 12/10/2015 1501   CO2 23 12/10/2015 1501   GLUCOSE 89 12/10/2015 1501   BUN 7 12/10/2015 1501   CREATININE 0.65 12/10/2015 1501   CALCIUM 9.1 12/10/2015 1501   PROT 7.4 12/10/2015 1501   ALBUMIN 3.7 12/10/2015 1501   AST 18 12/10/2015 1501   ALT 13 (L) 12/10/2015 1501   ALKPHOS 52 12/10/2015 1501   BILITOT 0.4 12/10/2015 1501   GFRNONAA >60 12/10/2015 1501   GFRAA >60 12/10/2015 1501         RADIOGRAPHY: [place] RT BREAST BX W LOC DEV 1ST LESION IMG BX SPEC US GUIDE  Addendum Date: 10/29/2022   ADDENDUM REPORT: 10/29/2022 11:59 ADDENDUM: Pathology revealed POORLY DIFFERENTIATED MALIGNANCY of the RIGHT breast, 11:00 o'clock, 10cmfn, (heart clip). This was found to be concordant by Dr. Dorise Bullion. The patient reported doing well after the biopsy with tenderness at the site when contacted by Stacie Acres, RN Nurse Navigator, on October 17, 2022. Post biopsy instructions and care were reviewed and questions were answered. The patient was encouraged to call [facility] for any additional concerns Pathology results were discussed with  Dayton Scrape, NP with [facility] on October 18, 2022. Pathology results were discussed with the patient in person by Dayton Scrape, NP, on October 22, 2022. The patient was given a copy of her biopsy results, her [facility] appointment and verbalized an understanding that additional imaging may be needed based upon treatment plan, per Valli Glance, NP. Surgical consultation has been arranged with Dr. Donnie Mesa at [facility] on October 29, 2022. Consideration of BILATERAL breast MRI as mass looks like it involves overlying skin of the RIGHT breast. MRI could better assess degree of skin involvement if clinically warranted. Pathology results reported by Terie Purser, RN on 10/21/2022. Electronically Signed   By: Dorise Bullion III M.D.   On: 10/29/2022 11:59   Result Date:  10/29/2022 CLINICAL DATA:  Biopsy of a right breast mass at 11 o'clock EXAM: ULTRASOUND GUIDED RIGHT BREAST CORE NEEDLE BIOPSY COMPARISON:  Previous exam(s). PROCEDURE: I met with the patient and we discussed the procedure of ultrasound-guided biopsy, including benefits and alternatives. We discussed the high likelihood of a successful procedure. We discussed the risks of the procedure, including infection, bleeding, tissue injury, clip migration, and inadequate sampling. Informed written consent was given. The usual time-out protocol was performed immediately prior to the procedure. Lesion quadrant: An 11 o'clock right breast mass Using sterile technique and 1% Lidocaine as local anesthetic, under direct ultrasound visualization, a 12 gauge spring-loaded device was used to perform biopsy of an 11 o'clock right breast mass using a lateral approach. At the conclusion of the procedure a heart shaped tissue marker clip was deployed into the biopsy cavity. Follow up 2 view mammogram was performed and dictated separately. IMPRESSION: Ultrasound guided biopsy of an 11 o'clock right breast mass. No apparent complications. Electronically Signed: By: Dorise Bullion III M.D. On: 10/16/2022 12:05  MM CLIP PLACEMENT RIGHT  Result Date: 10/16/2022 CLINICAL DATA:  Evaluate biopsy marker EXAM: 3D DIAGNOSTIC RIGHT MAMMOGRAM POST ULTRASOUND BIOPSY COMPARISON:  Previous exam(s). FINDINGS: 3D Mammographic images were obtained following ultrasound guided biopsy of a superficial right breast mass. The biopsy marking clip is in expected position at the site of biopsy. IMPRESSION: Appropriate positioning of the heart shaped biopsy marking clip at the site of biopsy in the biopsied right breast mass. Final Assessment: Post Procedure Mammograms for Marker Placement Electronically Signed   By: Dorise Bullion III M.D.   On: 10/16/2022 12:25  MM DIAG BREAST TOMO BILATERAL  Result Date: 10/10/2022 CLINICAL DATA:  42 year old female  presenting for evaluation of an enlarging right breast mass. Patient now has some associated tenderness. EXAM: DIGITAL DIAGNOSTIC BILATERAL MAMMOGRAM WITH TOMOSYNTHESIS; ULTRASOUND RIGHT BREAST LIMITED TECHNIQUE: Bilateral digital diagnostic mammography and breast tomosynthesis was performed.; Targeted ultrasound examination of the right breast was performed COMPARISON:  None available. ACR Breast Density Category b: There are scattered areas of fibroglandular density. FINDINGS: Mammogram: Right breast: A skin BB marks the palpable site of concern reported by the patient in the upper outer right breast. A spot tangential view of this area was performed in addition to standard views. At the palpable site there is an oval circumscribed mass measuring approximately 2.5 cm which appears to extend into the skin. There are no additional suspicious findings in the right breast. Left breast: No suspicious mass, distortion, or microcalcifications are identified to suggest presence of malignancy. On physical exam at the site of concern reported by the patient in the upper outer right breast there is a reddish purple skin nodule. Ultrasound:  Targeted ultrasound performed at the palpable site of concern at 11 o'clock 10 cm from the nipple demonstrating a superficial oval circumscribed hypoechoic mass measuring 2.2 x 1.3 x 1.7 cm, with extension into the skin. There is internal vascularity. Targeted ultrasound of the right axilla demonstrates normal lymph nodes. IMPRESSION: Indeterminate subdermal mass in the right breast at 11 o'clock measuring 2.2 cm. RECOMMENDATION: Ultrasound-guided core needle biopsy x1 of the right breast. I have discussed the findings and recommendations with the patient who agrees to proceed with biopsy. The patient will be scheduled for the biopsy appointment prior to leaving the office today. BI-RADS CATEGORY  4: Suspicious. Electronically Signed   By: Audie Pinto M.D.   On: 10/10/2022  16:16  US BREAST LTD UNI RIGHT INC AXILLA  Result Date: 10/10/2022 CLINICAL DATA:  42 year old female presenting for evaluation of an enlarging right breast mass. Patient now has some associated tenderness. EXAM: DIGITAL DIAGNOSTIC BILATERAL MAMMOGRAM WITH TOMOSYNTHESIS; ULTRASOUND RIGHT BREAST LIMITED TECHNIQUE: Bilateral digital diagnostic mammography and breast tomosynthesis was performed.; Targeted ultrasound examination of the right breast was performed COMPARISON:  None available. ACR Breast Density Category b: There are scattered areas of fibroglandular density. FINDINGS: Mammogram: Right breast: A skin BB marks the palpable site of concern reported by the patient in the upper outer right breast. A spot tangential view of this area was performed in addition to standard views. At the palpable site there is an oval circumscribed mass measuring approximately 2.5 cm which appears to extend into the skin. There are no additional suspicious findings in the right breast. Left breast: No suspicious mass, distortion, or microcalcifications are identified to suggest presence of malignancy. On physical exam at the site of concern reported by the patient in the upper outer right breast there is a reddish purple skin nodule. Ultrasound: Targeted ultrasound performed at the palpable site of concern at 11 o'clock 10 cm from the nipple demonstrating a superficial oval circumscribed hypoechoic mass measuring 2.2 x 1.3 x 1.7 cm, with extension into the skin. There is internal vascularity. Targeted ultrasound of the right axilla demonstrates normal lymph nodes. IMPRESSION: Indeterminate subdermal mass in the right breast at 11 o'clock measuring 2.2 cm. RECOMMENDATION: Ultrasound-guided core needle biopsy x1 of the right breast. I have discussed the findings and recommendations with the patient who agrees to proceed with biopsy. The patient will be scheduled for the biopsy appointment prior to leaving the office today.  BI-RADS CATEGORY  4: Suspicious. Electronically Signed   By: Audie Pinto M.D.   On: 10/10/2022 16:16     IMPRESSION/PLAN: ***   It was a pleasure meeting the patient today. We discussed the risks, benefits, and side effects of radiotherapy. I recommend radiotherapy to the *** to reduce her risk of locoregional recurrence by 2/3.  We discussed that radiation would take approximately *** weeks to complete and that I would give the patient a few weeks to heal following surgery before starting treatment planning. *** If chemotherapy were to be given, this would precede radiotherapy. We spoke about acute effects including skin irritation and fatigue as well as much less common late effects including internal organ injury or irritation. We spoke about the latest technology that is used to minimize the risk of late effects for patients undergoing radiotherapy to the breast or chest wall. No guarantees of treatment were given. The patient is enthusiastic about proceeding with treatment. I look forward to participating in the patient's care.  I will await her referral back to  me for postoperative follow-up and eventual CT simulation/treatment planning.  On date of service, in total, I spent *** minutes on this encounter. Patient was seen in person.   __________________________________________   Eppie Gibson, MD  This document serves as a record of services personally performed by Eppie Gibson, MD. It was created on her behalf by Roney Mans, a trained medical scribe. The creation of this record is based on the scribe's personal observations and the provider's statements to them. This document has been checked and approved by the attending provider.

## 2022-11-05 ENCOUNTER — Ambulatory Visit
Admission: RE | Admit: 2022-11-05 | Discharge: 2022-11-05 | Disposition: A | Discharge status: Home / Self Care | Payer: Medicaid Other | Source: Ambulatory Visit | Attending: Radiation Oncology | Admitting: Radiation Oncology

## 2022-11-05 ENCOUNTER — Encounter: Payer: Self-pay | Admitting: Radiation Oncology

## 2022-11-05 ENCOUNTER — Telehealth: Payer: Self-pay

## 2022-11-05 VITALS — BP 127/75 | HR 82 | Temp 97.1°F | Resp 18 | Ht 64.0 in | Wt 149.4 lb

## 2022-11-05 DIAGNOSIS — C493 Malignant neoplasm of connective and soft tissue of thorax: Secondary | ICD-10-CM

## 2022-11-05 DIAGNOSIS — D487 Neoplasm of uncertain behavior of other specified sites: Secondary | ICD-10-CM | POA: Diagnosis present

## 2022-11-05 DIAGNOSIS — C50411 Malignant neoplasm of upper-outer quadrant of right female breast: Secondary | ICD-10-CM | POA: Insufficient documentation

## 2022-11-05 NOTE — Telephone Encounter (Signed)
RN Anderson Malta called [facility] to acquire an update on the second pathology review request with [facility]. They stated the slides had not been returned (they had sent them to Dr. Kris Mouton in [place] for review, they are awaiting his addendum). Once the slides are returned they will be sent to [facility] for this second pathology review. They stated whey would reach out to Dr. Kris Mouton and obtain a timeline of when his addendum would be done (and also when slides would be returned). They stated the second review from [facility] would take several more days for completion. The number for [place] pathology is [phone].

## 2022-11-05 NOTE — Progress Notes (Signed)
Location of Breast Cancer:  Sarcoma of right chest wall   Histology per Pathology Report:  10/16/2022 Breast, right, needle core biopsy, 11:00 10 cmfn POORLY DIFFERENTIATED MALIGNANCY. SEE NOTE. Diagnosis Note - The neoplasm is characterized by sheets of cells with moderate to abundant eosinophilic cytoplasm, marked nuclear atypia and scattered mitotic figures. Immunohistochemistry is negative with cytokeratin AE1/AE3, cytokeratin 7, GATA3, Melan-A and CD45RO. Additional immunohistochemistry will be performed and reported as an addendum. The differential based on the morphologic features includes metaplastic carcinoma and high-grade sarcoma.  Receptor Status: N/A  Did patient present with symptoms (if so, please note symptoms) or was this found on screening mammography?: per Dr. Vonna Kotyk H&P 10/29/21 note: " a small skin lesion in the right upper outer quadrant of her breast for at least a year.  Initially this area was nonpigmented.  Over the last 4 months it has grown in size and has become dark purple.  It has also become painful.  About 4 months ago she also noticed a palpable mass underneath the skin lesion. She sought medical attention for this area and was first seen on 10/10/2022 by the nurse practitioner for the [facility] program"  Past/Anticipated interventions by surgeon, if any:  10/29/2022 --Dr. Donnie Mesa (office visit) At this point, the pathology report is rather vague.  We will send the tissue to [facility] for a definitive second opinion. We will obtain a MRI of the breast to further evaluate both breast and both axilla. Referrals to oncology, radiation oncology, and genetics. If it appears that this mass is localized to the area directly underneath the skin lesion, she will likely need a generous lumpectomy including the overlying skin.  At this point this does not appear to be atypical malignancy within the breast tissue itself.  It may be a soft tissue sarcoma in the  subcutaneous tissue just adjacent to the upper edge of her breast.  I spent a significant amount of time with the patient and her husband via interpreter discussing her case and the recommended plan prior to planning surgery.  We will try to expedite all of the studies and referrals. Return in about 2 weeks (around 11/12/2022).   Past/Anticipated interventions by medical oncology, if any:  Scheduled for consultation with Dr. Arletha Pili Iruku tomorrow 11/06/2022  Lymphedema issues, if any:  Denies    Pain issues, if any:  Reports occasional breast discomfort and shoulder pain when trying to reach behind her back   SAFETY ISSUES: Prior radiation? No Pacemaker/ICD? No Possible current pregnancy? No--IUD in place; reports LMP was around 10/10/2022 Is the patient on methotrexate? No   Current Complaints / other details:  Breast MRI scheduled for 11/08/2022. Met with genetics counselor yesterday

## 2022-11-06 ENCOUNTER — Other Ambulatory Visit: Payer: Self-pay | Admitting: *Deleted

## 2022-11-06 ENCOUNTER — Inpatient Hospital Stay (HOSPITAL_BASED_OUTPATIENT_CLINIC_OR_DEPARTMENT_OTHER): Payer: Medicaid Other | Admitting: Hematology and Oncology

## 2022-11-06 ENCOUNTER — Inpatient Hospital Stay: Payer: Medicaid Other

## 2022-11-06 VITALS — BP 118/84 | HR 93 | Temp 97.9°F | Resp 16 | Ht 64.0 in | Wt 148.5 lb

## 2022-11-06 DIAGNOSIS — Z793 Long term (current) use of hormonal contraceptives: Secondary | ICD-10-CM | POA: Diagnosis not present

## 2022-11-06 DIAGNOSIS — C50411 Malignant neoplasm of upper-outer quadrant of right female breast: Secondary | ICD-10-CM | POA: Diagnosis present

## 2022-11-06 LAB — CMP (CANCER CENTER ONLY)
ALT: 19 U/L (ref 0–44)
AST: 16 U/L (ref 15–41)
Albumin: 4.2 g/dL (ref 3.5–5.0)
Alkaline Phosphatase: 61 U/L (ref 38–126)
Anion gap: 5 (ref 5–15)
BUN: 9 mg/dL (ref 6–20)
CO2: 28 mmol/L (ref 22–32)
Calcium: 8.9 mg/dL (ref 8.9–10.3)
Chloride: 104 mmol/L (ref 98–111)
Creatinine: 0.59 mg/dL (ref 0.44–1.00)
GFR, Estimated: >60 mL/min (ref >60)
Glucose, Bld: 95 mg/dL (ref 70–99)
Potassium: 4 mmol/L (ref 3.5–5.1)
Sodium: 137 mmol/L (ref 135–145)
Total Bilirubin: 0.3 mg/dL (ref 0.3–1.2)
Total Protein: 7.2 g/dL (ref 6.5–8.1)

## 2022-11-06 LAB — CBC WITH DIFFERENTIAL (CANCER CENTER ONLY)
Abs Immature Granulocytes: 0.01 10*3/uL (ref 0.00–0.07)
Basophils Absolute: 0 10*3/uL (ref 0.0–0.1)
Basophils Relative: 1 %
Eosinophils Absolute: 0.2 10*3/uL (ref 0.0–0.5)
Eosinophils Relative: 2 %
HCT: 43.3 % (ref 36.0–46.0)
Hemoglobin: 14.8 g/dL (ref 12.0–15.0)
Immature Granulocytes: 0 %
Lymphocytes Relative: 34 %
Lymphs Abs: 2.5 10*3/uL (ref 0.7–4.0)
MCH: 29.4 pg (ref 26.0–34.0)
MCHC: 34.2 g/dL (ref 30.0–36.0)
MCV: 86.1 fL (ref 80.0–100.0)
Monocytes Absolute: 0.5 10*3/uL (ref 0.1–1.0)
Monocytes Relative: 7 %
Neutro Abs: 4.2 10*3/uL (ref 1.7–7.7)
Neutrophils Relative %: 56 %
Platelet Count: 310 10*3/uL (ref 150–400)
RBC: 5.03 MIL/uL (ref 3.87–5.11)
RDW: 12.3 % (ref 11.5–15.5)
WBC Count: 7.4 10*3/uL (ref 4.0–10.5)
nRBC: 0 % (ref 0.0–0.2)

## 2022-11-06 NOTE — Addendum Note (Signed)
Addended by: Adaline Sill on: 11/06/2022 12:37 PM   Modules accepted: Orders

## 2022-11-06 NOTE — Assessment & Plan Note (Signed)
This is a very pleasant 42 year old premenopausal female patient who presented to the breast clinic for right breast poorly differentiated malignancy.  The mass was identified at 11 o'clock position 10 cm from the nipple and has been slowly growing over the past year.  She denies any other concerning symptoms.  On exam, this appears to be a very superficial mass with black pigmentation measuring about 2 cm and no other palpable breast changes or regional adenopathy.  It feels more like a mass arising from the skin rather than the breast tissue.  I have reviewed the pathology which also talks about poorly differentiated malignancy with sheets of cells with marked nuclear atypia and scattered mitotic figures, negative for GATA3, CK7, cytokeratin AE 1, Melan-A.  Additional immunohistochemistry was thought to be performed.  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  We do not have any additional information from pathology at this time.  Apparently this pathology was sent out for second opinion.  I will recommend upfront surgery given poorly differentiated carcinoma.  We have also discussed about staging scans.  I think this is appropriate especially if this could be indeed something like a melanoma.  I will attempt ordering PET/CT.  She will proceed with MRI as scheduled, will follow-up with Dr. Georgette Dover and myself in about a couple weeks.  I would prefer to see her after surgery if possible so we can review the final pathology again and discuss adjuvant recommendations.  All their questions were answered to the best my knowledge.  Thank you for consulting [place] in the care of this patient.  Please not hesitate to contact us with any additional questions or concerns.

## 2022-11-06 NOTE — Progress Notes (Signed)
[facility] CONSULT NOTE  Patient Care Team: Antonietta Jewel, MD as PCP - General (Internal Medicine)  CHIEF COMPLAINTS/PURPOSE OF CONSULTATION:  Newly diagnosed breast cancer  HISTORY OF PRESENTING ILLNESS:   Karen Berry 42 y.o. female is here because of recent diagnosis of right breast cancer. I reviewed her records extensively and collaborated the history with the patient.  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 Targeted ultrasound performed at the palpable site of concern at 11o'clock 10 cm from the nipple demonstrating a superficial oval circumscribed hypoechoic mass measuring 2.2 x 1.3 x 1.7 cm, with extension into the skin. There is internal vascularity.   Targeted ultrasound of the right axilla demonstrates normal lymph nodes.   10/16/2022 Pathology Results   Pathology from the right breast needle core biopsy shows poorly differentiated malignancy.  The neoplasm is characterized by sheets of cells with moderate to abundant eosinophilic cytoplasm, marked nuclear atypia and scattered mitotic figures. Immunohistochemistry is negative with cytokeratin AE1/AE3, cytokeratin 7, GATA3, Melan-A and CD45RO. Additional immunohistochemistry will be performed and reported as an addendum. The differential based on the morphologic features includes metaplastic carcinoma and high-grade sarcoma.   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 She otherwise is very healthy  at baseline, has 6 kids has an IUD in place.  She has some occasional joint pains but no major change in breathing, bone pains, change in bowel habits or urinary habits.  Her menstrual cycles are irregular but this could be from the IUD. Rest of the pertinent 10 point ROS reviewed and negative  MEDICAL HISTORY:  Past Medical History:  Diagnosis Date   Anemia    Thyroid disease     SURGICAL HISTORY: Past Surgical History:  Procedure Laterality Date   BREAST BIOPSY Right 10/16/2022   [place] RT BREAST BX W LOC DEV 1ST LESION IMG BX [place] US GUIDE 10/16/2022 [facility] MAMMOGRAPHY   female circumcision     NO PAST SURGERIES      SOCIAL HISTORY: Social History   Socioeconomic History   Marital status: Married    Spouse name: Not on file   Number of children: 6   Years of education: Not on file   Highest education level: Not on file  Occupational History   Not on file  Tobacco Use   Smoking status: Never   Smokeless tobacco: Never  Vaping Use   Vaping Use: Never used  Substance and Sexual Activity   Alcohol use: No   Drug use: No   Sexual activity: Yes    Birth control/protection: I.U.D.  Other Topics Concern   Not on file  Social History Narrative   Not on file   Social Determinants of Health   Financial Resource Strain: Not on file  Food Insecurity: No Food Insecurity (10/10/2022)   Hunger Vital Sign    Worried About Running Out of Food in the Last Year: Never true    Ran Out of  Food in the Last Year: Never true  Transportation Needs: No Transportation Needs (10/10/2022)   PRAPARE - Hydrologist (Medical): No    Lack of Transportation (Non-Medical): No  Physical Activity: Not on file  Stress: Not on file  Social Connections: Not on file  Intimate Partner Violence: Not on file    FAMILY HISTORY: No family history on file.  ALLERGIES:  has No Known Allergies.  MEDICATIONS:  Current Outpatient Medications  Medication Sig Dispense Refill    ibuprofen (ADVIL,MOTRIN) 800 MG tablet Take 1 tablet (800 mg total) by mouth 3 (three) times daily. 21 tablet 0   levonorgestrel (MIRENA) 20 MCG/DAY IUD 1 each by Intrauterine route once. Placed 08/21/2011     No current facility-administered medications for this visit.    REVIEW OF SYSTEMS:   Constitutional: Denies fevers, chills or abnormal night sweats Eyes: Denies blurriness of vision, double vision or watery eyes Ears, nose, mouth, throat, and face: Denies mucositis or sore throat Respiratory: Denies cough, dyspnea or wheezes Cardiovascular: Denies palpitation, chest discomfort or lower extremity swelling Gastrointestinal:  Denies nausea, heartburn or change in bowel habits Skin: Denies abnormal skin rashes Lymphatics: Denies new lymphadenopathy or easy bruising Neurological:Denies numbness, tingling or new weaknesses Behavioral/Psych: Mood is stable, no new changes  Breast: Denies any palpable lumps or discharge All other systems were reviewed with the patient and are negative.  PHYSICAL EXAMINATION: ECOG PERFORMANCE STATUS: 0 - Asymptomatic  Vitals:   11/06/22 1142  BP: 118/84  Pulse: 93  Resp: 16  Temp: 97.9 F (36.6 C)  SpO2: 100%   Filed Weights   11/06/22 1142  Weight: 148 lb 8 oz (67.4 kg)    GENERAL:alert, no distress and comfortable SKIN: skin color, texture, turgor are normal, no rashes or significant lesions EYES: normal, conjunctiva are pink and non-injected, sclera clear OROPHARYNX:no exudate, no erythema and lips, buccal mucosa, and tongue normal  NECK: supple, thyroid normal size, non-tender, without nodularity LYMPH:  no palpable lymphadenopathy in the cervical, axillary LUNGS: clear to auscultation and percussion with normal breathing effort HEART: regular rate & rhythm and no murmurs and no lower extremity edema ABDOMEN:abdomen soft, non-tender and normal bowel sounds Musculoskeletal:no cyanosis of digits and no clubbing  PSYCH: alert & oriented x  3 with fluent speech NEURO: no focal motor/sensory deficits BREAST: Bilateral breasts inspected.  No palpable masses in the breast.  The area of concern appears to be arising from skin and is very superficial, measures about 2 cm in largest dimension, has black pigmentation.  No palpable regional adenopathy.  No other skin changes noted today.  LABORATORY DATA:  I have reviewed the data as listed Lab Results  Component Value Date   WBC 6.9 12/10/2015   HGB 14.0 12/10/2015   HCT 42.6 12/10/2015   MCV 86.1 12/10/2015   PLT 260 12/10/2015   Lab Results  Component Value Date   NA 136 12/10/2015   K 3.8 12/10/2015   CL 105 12/10/2015   CO2 23 12/10/2015    RADIOGRAPHIC STUDIES: I have personally reviewed the radiological reports and agreed with the findings in the report.  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  The mass was identified at 11 o'clock position 10 cm from the nipple and has been slowly growing over the past year.  She denies any other concerning symptoms.  On exam, this appears to be a very superficial mass with black pigmentation measuring about 2 cm and no other palpable breast changes or regional adenopathy.  It feels more like a mass arising from the skin rather than the breast tissue.  I have reviewed the pathology which also talks about poorly differentiated malignancy with sheets of cells with marked nuclear atypia and scattered mitotic figures, negative for GATA3, CK7, cytokeratin AE 1, Melan-A.  Additional immunohistochemistry was thought to be performed.  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  We do not have any additional information from pathology at this time.  Apparently this pathology was sent out for second opinion.  I will  recommend upfront surgery given poorly differentiated carcinoma.  We have also discussed about staging scans.  I think this is appropriate especially if this could be indeed something like a melanoma.  I will attempt ordering PET/CT.  She will proceed with MRI as scheduled, will follow-up with Dr. Georgette Dover and myself in about a couple weeks.  I would prefer to see her after surgery if possible so we can review the final pathology again and discuss adjuvant recommendations.  All their questions were answered to the best my knowledge.  Thank you for consulting [place] in the care of this patient.  Please not hesitate to contact us with any additional questions or concerns.  Total time spent: 60 minutes including history, review of imaging pathology report, physical exam, counseling, coordination of care. All questions were answered. The patient knows to call the clinic with any problems, questions or concerns.    Benay Pike, MD 11/06/22

## 2022-11-07 ENCOUNTER — Telehealth: Payer: Self-pay | Admitting: Hematology and Oncology

## 2022-11-07 NOTE — Telephone Encounter (Signed)
Left patient a vm regarding upcoming appointment

## 2022-11-08 ENCOUNTER — Ambulatory Visit
Admission: RE | Admit: 2022-11-08 | Discharge: 2022-11-08 | Disposition: A | Discharge status: Home / Self Care | Payer: No Typology Code available for payment source | Source: Ambulatory Visit | Attending: Surgery | Admitting: Surgery

## 2022-11-08 ENCOUNTER — Ambulatory Visit: Payer: Self-pay | Admitting: Surgery

## 2022-11-08 DIAGNOSIS — C493 Malignant neoplasm of connective and soft tissue of thorax: Secondary | ICD-10-CM

## 2022-11-08 MED ORDER — GADOPICLENOL 0.5 MMOL/ML IV SOLN
6.0000 mL | Freq: Once | INTRAVENOUS | Status: AC | PRN
  Administered 2022-11-08: 6 mL via INTRAVENOUS

## 2022-11-11 ENCOUNTER — Ambulatory Visit: Payer: Self-pay | Admitting: Surgery

## 2022-11-11 ENCOUNTER — Other Ambulatory Visit: Payer: Self-pay | Admitting: *Deleted

## 2022-11-11 DIAGNOSIS — C50411 Malignant neoplasm of upper-outer quadrant of right female breast: Secondary | ICD-10-CM

## 2022-11-11 NOTE — Therapy (Signed)
OUTPATIENT PHYSICAL THERAPY BREAST CANCER BASELINE EVALUATION   Patient Name: Karen Berry MRN: [id number] DOB:1981-03-08, 42 y.o., female Today's Date: 11/12/2022  END OF SESSION:  PT End of Session - 11/12/22 1248     Visit Number 1    Number of Visits 2    Date for PT Re-Evaluation 12/24/22    PT Start Time 1203    PT Stop Time 1245    PT Time Calculation (min) 42 min    Activity Tolerance Patient tolerated treatment well    Behavior During Therapy [facility] for tasks assessed/performed             Past Medical History:  Diagnosis Date   Anemia    Thyroid disease    Past Surgical History:  Procedure Laterality Date   BREAST BIOPSY Right 10/16/2022   [place] RT BREAST BX W LOC DEV 1ST LESION IMG BX [place] US GUIDE 10/16/2022 [facility]AMMOGRAPHY   female circumcision     NO PAST SURGERIES     Patient Active Problem List   Diagnosis Date Noted   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 ([place]) 11/05/2022   Loss of weight 02/02/2013   Bacteremia 02/02/2013   Pyelonephritis 01/31/2013   IUD (intrauterine device) in place 08/21/2011     REFERRING PROVIDER: MatthewTsuei MD  REFERRING DIAG: Right Breast Cancer  THERAPY DIAG:  Malignant neoplasm of upper-outer quadrant of right female breast, unspecified estrogen receptor status ([place])  Abnormal posture  Rationale for Evaluation and Treatment: Rehabilitation  ONSET DATE: 10/18/2022  SUBJECTIVE:                                                                                                                                                                                           SUBJECTIVE STATEMENT: Patient reports she is here today to be seen by her medical team for her newly diagnosed right breast cancer.   PERTINENT HISTORY:  Patient was diagnosed on 10/18/2022 with right breast mass measures 2.2 cm and is located in the upper-outer quadrant. 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 Biopsy was sent to [facility] for a second opinion.and is not back yet.  PATIENT GOALS:   reduce lymphedema risk and learn post op HEP.   PAIN:  Are you having pain? No  PRECAUTIONS: Active CA   HAND DOMINANCE: right  WEIGHT BEARING RESTRICTIONS: No  FALLS:  Has patient fallen in last 6 months? Yes, 1 x LIVING ENVIRONMENT: Patient lives with: family including husband and 6 kids Lives in: House/apartment Has following equipment at home: None  OCCUPATION: Camera operator  LEISURE: walking sometimes  PRIOR  LEVEL OF FUNCTION: Independent   OBJECTIVE:  COGNITION: Overall cognitive status: Within functional limits for tasks assessed    POSTURE:  Forward head and rounded shoulders posture  UPPER EXTREMITY AROM/PROM:  A/PROM RIGHT   eval   Shoulder extension 75  Shoulder flexion 158  Shoulder abduction 180  Shoulder internal rotation 60  Shoulder external rotation 115    (Blank rows = not tested)  A/PROM LEFT   eval  Shoulder extension 64  Shoulder flexion 165  Shoulder abduction 180  Shoulder internal rotation 60  Shoulder external rotation 112    (Blank rows = not tested)  CERVICAL AROM: All within funcitnal limits:      UPPER EXTREMITY STRENGTH: WNL  LYMPHEDEMA ASSESSMENTS:   LANDMARK RIGHT   eval  10 cm proximal to olecranon process 29.8  Olecranon process 24.2  10 cm proximal to ulnar styloid process 20.7  Just proximal to ulnar styloid process 15.35  Across hand at thumb web space 18.5  At base of 2nd digit 5.7  (Blank rows = not tested)  LANDMARK LEFT   eval  10 cm proximal to olecranon process 28.4  Olecranon process 23.5  10 cm proximal to ulnar styloid process 20.0  Just proximal to ulnar styloid process 14.7  Across hand at thumb web space 17.7  At base of 2nd digit 5.7  (Blank rows = not tested)  L-DEX LYMPHEDEMA SCREENING:  The patient was assessed using the L-Dex machine today to produce a lymphedema index  baseline score. The patient will be reassessed on a regular basis (typically every 3 months) to obtain new L-Dex scores. If the score is > 6.5 points away from his/her baseline score indicating onset of subclinical lymphedema, it will be recommended to wear a compression garment for 4 weeks, 12 hours per day and then be reassessed. If the score continues to be > 6.5 points from baseline at reassessment, we will initiate lymphedema treatment. Assessing in this manner has a 95% rate of preventing clinically significant lymphedema.   L-DEX FLOWSHEETS - 11/12/22 1200       L-DEX LYMPHEDEMA SCREENING   Measurement Type Unilateral    L-DEX MEASUREMENT EXTREMITY Upper Extremity    POSITION  Standing    DOMINANT SIDE Right    At Risk Side Right    BASELINE SCORE (UNILATERAL) 3.9             QUICK DASH SURVEY: 0%  PATIENT EDUCATION:  Education details: Lymphedema risk reduction and post op shoulder/posture HEP, compression bra, [place] screen,ABC classs Person educated: Patient Education method: Explanation, Demonstration, Handout Education comprehension: Patient verbalized understanding and returned demonstration  HOME EXERCISE PROGRAM: Patient was instructed today in a home exercise program today for post op shoulder range of motion. These included active assist shoulder flexion in sitting, scapular retraction, wall walking with shoulder abduction, and hands behind head external rotation.  She was encouraged to do these twice a day, holding 3 seconds and repeating 5 times when permitted by her physician.   ASSESSMENT:  CLINICAL IMPRESSION: pts multidisciplinary medical team met prior to her assessments to determine a recommended treatment plan. She is planning to have a right lumpectomy with axillary LN biopsy on 11/21/2022 for right breast sarcoma?Marland Kitchen She will benefit from a post op PT reassessment to determine needs and from L-Dex screens every 3 months for 2 years to detect subclinical  lymphedema.  Pt will benefit from skilled therapeutic intervention to improve on the following deficits: Decreased knowledge of precautions,  impaired UE functional use, pain, decreased ROM, postural dysfunction.   PT treatment/interventions: ADL/self-care home management, pt/family education, therapeutic exercise  REHAB POTENTIAL: Excellent  CLINICAL DECISION MAKING: Stable/uncomplicated  EVALUATION COMPLEXITY: Low   GOALS: Goals reviewed with patient? YES  LONG TERM GOALS: (STG=LTG)    Name Target Date Goal status  1 Pt will be able to verbalize understanding of pertinent lymphedema risk reduction practices relevant to her dx specifically related to skin care.  Baseline:  No knowledge 11/12/2022 Achieved at eval  2 Pt will be able to return demo and/or verbalize understanding of the post op HEP related to regaining shoulder ROM. Baseline:  No knowledge 11/12/2022 Achieved at eval  3 Pt will be able to verbalize understanding of the importance of attending the post op After Breast CA Class for further lymphedema risk reduction education and therapeutic exercise.  Baseline:  No knowledge 11/12/2022 Achieved at eval  4 Pt will demo she has regained full shoulder ROM and function post operatively compared to baselines.  Baseline: See objective measurements taken today. 12/24/2022 INITIAL    PLAN:  PT FREQUENCY/DURATION: EVAL and 1 follow up appointment.   PLAN FOR NEXT SESSION: will reassess 3-4 weeks post op to determine needs.   Patient will follow up at outpatient cancer rehab 3-4 weeks following surgery.  If the patient requires physical therapy at that time, a specific plan will be dictated and sent to the referring physician for approval. The patient was educated today on appropriate basic range of motion exercises to begin post operatively and the importance of attending the After Breast Cancer class following surgery.  Patient was educated today on lymphedema risk reduction  practices as it pertains to recommendations that will benefit the patient immediately following surgery.  She verbalized good understanding.    Physical Therapy Information for After Breast Cancer Surgery/Treatment:  Lymphedema is a swelling condition that you may be at risk for in your arm if you have lymph nodes removed from the armpit area.  After a sentinel node biopsy, the risk is approximately 5-9% and is higher after an axillary node dissection.  There is treatment available for this condition and it is not life-threatening.  Contact your physician or physical therapist with concerns. You may begin the 4 shoulder/posture exercises (see additional sheet) when permitted by your physician (typically a week after surgery).  If you have drains, you may need to wait until those are removed before beginning range of motion exercises.  A general recommendation is to not lift your arms above shoulder height until drains are removed.  These exercises should be done to your tolerance and gently.  This is not a "no pain/no gain" type of recovery so listen to your body and stretch into the range of motion that you can tolerate, stopping if you have pain.  If you are having immediate reconstruction, ask your plastic surgeon about doing exercises as he or she may want you to wait. We encourage you to attend the free one time ABC (After Breast Cancer) class offered by [facility].  You will learn information related to lymphedema risk, prevention and treatment and additional exercises to regain mobility following surgery.  You can call [phone] for more information.  This is offered the 1st and 3rd Monday of each month.  You only attend the class one time. While undergoing any medical procedure or treatment, try to avoid blood pressure being taken or needle sticks from occurring on the arm on the  side of cancer.   This recommendation begins after surgery and continues for the rest of your  life.  This may help reduce your risk of getting lymphedema (swelling in your arm). An excellent resource for those seeking information on lymphedema is the National Lymphedema Network's web site. It can be accessed at [place].org If you notice swelling in your hand, arm or breast at any time following surgery (even if it is many years from now), please contact your doctor or physical therapist to discuss this.  Lymphedema can be treated at any time but it is easier for you if it is treated early on.  If you feel like your shoulder motion is not returning to normal in a reasonable amount of time, please contact your surgeon or physical therapist.  [place] [phone]. [address], [address], [place] [address]  ABC CLASS After Breast Cancer Class  After Breast Cancer Class is a specially designed exercise class to assist you in a safe recover after having breast cancer surgery.  In this class you will learn how to get back to full function whether your drains were just removed or if you had surgery a month ago.  This one-time class is held the 1st and 3rd Monday of every month from 11:00 a.m. until 12:00 noon virtually.  This class is FREE and space is limited. For more information or to register for the next available class, call [phone].  Class Goals  Understand specific stretches to improve the flexibility of you chest and shoulder. Learn ways to safely strengthen your upper body and improve your posture. Understand the warning signs of infection and why you may be at risk for an arm infection. Learn about Lymphedema and prevention.  ** You do not attend this class until after surgery.  Drains must be removed to participate  Patient was instructed today in a home exercise program today for post op shoulder range of motion. These included active assist shoulder flexion in sitting, scapular retraction, wall walking with shoulder  abduction, and hands behind head external rotation.  She was encouraged to do these twice a day, holding 3 seconds and repeating 5 times when permitted by her physician.    Claris Pong, PT 11/12/2022, 12:53 PM

## 2022-11-11 NOTE — H&P (View-Only) (Signed)
 Subjective   Chief Complaint: follow up visit (Breast recheck)  [facility] Arabic interpreter was used.  Oncology - [place]  History of Present Illness: Karen Berry is a 42 y.o. female who is seen today as an office consultation at the request of Dr. Self for evaluation of follow up visit (Breast recheck) .    This is a 42-year-old female from [place] who presents with a small skin lesion in the right upper outer quadrant of her breast for at least a year.  Initially this area was nonpigmented.  Over the last 4 months it has grown in size and has become dark purple.  It has also become painful.  About 4 months ago she also noticed a palpable mass underneath the skin lesion.  She sought medical attention for this area and was first seen on 10/10/2022 by the nurse practitioner for the [facility] program.  Imaging was done urgently that revealed a 2.2 x 1.3 x 1.7 cm hypoechoic mass located at 11:00 in the right breast 10 cm from the nipple.  This extends into the skin.  There is internal vascularity.  Axillary ultrasound showed normal lymph nodes.  She underwent biopsy on 10/16/2022.  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  The report stated that additional immunohistochemistry would be performed and reported as an addendum.  At this point, no addendum has been seen in the patient's record.  The patient has no family history of breast cancer.  She has 6 children.  The youngest is 11.  MRI showed only a 2.4 x 2.3 cm mass in the upper outer right breast with overlying skin involvement.  There is an adjacent intramammary lymph node.  No other lymphadenopathy noted.  Pathology has sent her biopsy to [place] as well as to [facility] for second opinions.  Those second opinions are still pending.  Today she is accompanied by her son.   Review of Systems: A complete review of systems was obtained from the  patient.  I have reviewed this information and discussed as appropriate with the patient.  See HPI as well for other ROS.  Review of Systems  Constitutional: Negative.   HENT: Negative.    Eyes: Negative.   Respiratory: Negative.    Cardiovascular: Negative.   Gastrointestinal: Negative.   Genitourinary: Negative.   Musculoskeletal: Negative.   Skin: Negative.   Neurological: Negative.   Endo/Heme/Allergies: Negative.   Psychiatric/Behavioral: Negative.        Medical History: Past Medical History:  Diagnosis Date   Anemia    Thyroid disease     Patient Active Problem List  Diagnosis   Sarcoma of chest wall (CMS-HCC)    History reviewed. No pertinent surgical history.    No Known Allergies  No current outpatient medications on file prior to visit.   No current facility-administered medications on file prior to visit.    History reviewed. No pertinent family history.   Social History   Tobacco Use  Smoking Status Never  Smokeless Tobacco Never     Social History   Socioeconomic History   Marital status: Married  Tobacco Use   Smoking status: Never   Smokeless tobacco: Never  Vaping Use   Vaping Use: Never used  Substance and Sexual Activity   Alcohol use: Never   Drug use: Never    Objective:    Vitals:   11/11/22 1123  PainSc: 0-No pain    There   is no height or weight on file to calculate BMI.  Physical Exam   Constitutional:  WDWN in NAD, conversant, no obvious deformities; lying in bed comfortably Eyes:  Pupils equal, round; sclera anicteric; moist conjunctiva; no lid lag HENT:  Oral mucosa moist; good dentition  Neck:  No masses palpated, trachea midline; no thyromegaly Lungs:  CTA bilaterally; normal respiratory effort Breasts:  symmetric, no nipple changes; no palpable masses or lymphadenopathy on left side;   The right breast shows no nipple changes.  No palpable masses within the breast tissue itself with the exception of the  extreme upper outer border of the breast where it meets the chest wall just anterior to the axilla.  There is a 1 cm dark purple pigmented area with a firm 2 cm mass underneath.  No drainage from this area.  No palpable lymphadenopathy in the adjacent right axilla.  The mass is firm and fairly well-demarcated. CV:  Regular rate and rhythm; no murmurs; extremities well-perfused with no edema Abd:  +bowel sounds, soft, non-tender, no palpable organomegaly; no palpable hernias Musc:  Unable to assess gait; no apparent clubbing or cyanosis in extremities Lymphatic:  No palpable cervical or axillary lymphadenopathy Skin:  Warm, dry; no sign of jaundice Psychiatric - alert and oriented x 4; calm mood and affect   Labs, Imaging and Diagnostic Testing: Diagnosis Breast, right, needle core biopsy, 11:00 10 cmfn POORLY DIFFERENTIATED MALIGNANCY. SEE NOTE. Diagnosis Note The neoplasm is characterized by sheets of cells with moderate to abundant eosinophilic cytoplasm, marked nuclear atypia and scattered mitotic figures. Immunohistochemistry is negative with cytokeratin AE1/AE3, cytokeratin 7, GATA3, Melan-A and [id number]. Additional immunohistochemistry will be performed and reported as an addendum. The differential based on the morphologic features includes metaplastic carcinoma and high-grade sarcoma. Dr. Sharma agrees. [facility] was notified on 10/17/2022. JOHN PATRICK MD Pathologist, Electronic Signature (Case signed 10/18/2022)  CLINICAL DATA:  Patient with poorly differentiated right breast malignancy.   EXAM: BILATERAL BREAST MRI WITH AND WITHOUT CONTRAST   TECHNIQUE: Multiplanar, multisequence MR images of both breasts were obtained prior to and following the intravenous administration of 6 ml of Vueway   Three-dimensional MR images were rendered by post-processing of the original MR data on an independent workstation. The three-dimensional MR images were interpreted,  and findings are reported in the following complete MRI report for this study. Three dimensional images were evaluated at the independent interpreting workstation using the DynaCAD thin client.   COMPARISON:  Previous exam(s).   FINDINGS: Breast composition: b. Scattered fibroglandular tissue.   Background parenchymal enhancement: Mild   Right breast: Within the upper-outer right breast there is a 2.4 x 2.3 cm oval circumscribed enhancing mass compatible with biopsy-proven malignancy. Mass has direct involvement of the overlying skin. No additional concerning areas of enhancement identified within the right breast.   Left breast: No mass or abnormal enhancement.   Lymph nodes: Normal bilateral axillary lymph nodes. Within the upper-outer quadrant of the right breast there is an intramammary lymph node (image 46; series 3) with suggestion of abnormal cortical thickening.   Ancillary findings:  None.   IMPRESSION: 1. Biopsy-proven malignancy within the upper-outer right breast with direct involvement of the overlying skin. 2. Within the upper-outer quadrant of the right breast, adjacent to the mass is an intramammary lymph node with suggestion of cortical thickening, indeterminate.   RECOMMENDATION: Second-look ultrasound to identify and evaluate the lymph node within the upper-outer quadrant of the right breast posterior depth. If this   demonstrates abnormal morphology on ultrasound, ultrasound-guided biopsy would be warranted.   Treatment plan for right breast malignancy.   BI-RADS CATEGORY  4: Suspicious.     Electronically Signed   By: Drew  Davis M.D.   On: 11/08/2022 12:03    CLINICAL DATA:  42-year-old female presenting for evaluation of an enlarging right breast mass. Patient now has some associated tenderness.   EXAM: DIGITAL DIAGNOSTIC BILATERAL MAMMOGRAM WITH TOMOSYNTHESIS; ULTRASOUND RIGHT BREAST LIMITED   TECHNIQUE: Bilateral digital diagnostic  mammography and breast tomosynthesis was performed.; Targeted ultrasound examination of the right breast was performed   COMPARISON:  None available.   ACR Breast Density Category b: There are scattered areas of fibroglandular density.   FINDINGS: Mammogram:   Right breast: A skin BB marks the palpable site of concern reported by the patient in the upper outer right breast. A spot tangential view of this area was performed in addition to standard views. At the palpable site there is an oval circumscribed mass measuring approximately 2.5 cm which appears to extend into the skin. There are no additional suspicious findings in the right breast.   Left breast: No suspicious mass, distortion, or microcalcifications are identified to suggest presence of malignancy.   On physical exam at the site of concern reported by the patient in the upper outer right breast there is a reddish purple skin nodule.   Ultrasound:   Targeted ultrasound performed at the palpable site of concern at 11 o'clock 10 cm from the nipple demonstrating a superficial oval circumscribed hypoechoic mass measuring 2.2 x 1.3 x 1.7 cm, with extension into the skin. There is internal vascularity.   Targeted ultrasound of the right axilla demonstrates normal lymph nodes.   IMPRESSION: Indeterminate subdermal mass in the right breast at 11 o'clock measuring 2.2 cm.   RECOMMENDATION: Ultrasound-guided core needle biopsy x1 of the right breast.   I have discussed the findings and recommendations with the patient who agrees to proceed with biopsy. The patient will be scheduled for the biopsy appointment prior to leaving the office today.   BI-RADS CATEGORY  4: Suspicious.     Electronically Signed   By: Nancy  Ballantyne M.D.   On: 10/10/2022 16:16  Assessment and Plan:  Diagnoses and all orders for this visit:  Sarcoma of chest wall (CMS-HCC)   At this point, it is still unclear whether this malignancy  represents a sarcoma or an unusual breast cancer.  Rather than waiting for a final pathology report, I would like to move ahead with plans for wide local excision as well as a sentinel lymph node biopsy.  We will plan a right breast lumpectomy with excision of the overlying skin as well as a right axillary sentinel lymph node biopsy.  The surgical procedure has been discussed with the patient.  Potential risks, benefits, alternative treatments, and expected outcomes have been explained.  All of the patient's questions at this time have been answered.  The likelihood of reaching the patient's treatment goal is good.  The patient understand the proposed surgical procedure and wishes to proceed.     Karen Magri KAI Kalliope Riesen, MD  11/11/2022 12:00 PM   

## 2022-11-11 NOTE — H&P (Signed)
Subjective   Chief Complaint: follow up visit (Breast recheck)  [facility] Arabic interpreter was used.  Oncology - [place]  History of Present Illness: Karen Berry is a 42 y.o. female who is seen today as an office consultation at the request of Dr. Pasty Arch for evaluation of follow up visit (Breast recheck) .    This is a 42 year old female from [place] who presents with a small skin lesion in the right upper outer quadrant of her breast for at least a year.  Initially this area was nonpigmented.  Over the last 4 months it has grown in size and has become dark purple.  It has also become painful.  About 4 months ago she also noticed a palpable mass underneath the skin lesion.  She sought medical attention for this area and was first seen on 10/10/2022 by the nurse practitioner for the [facility] program.  Imaging was done urgently that revealed a 2.2 x 1.3 x 1.7 cm hypoechoic mass located at 11:00 in the right breast 10 cm from the nipple.  This extends into the skin.  There is internal vascularity.  Axillary ultrasound showed normal lymph nodes.  She underwent biopsy on 10/16/2022.  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  The report stated that additional immunohistochemistry would be performed and reported as an addendum.  At this point, no addendum has been seen in the patient's record.  The patient has no family history of breast cancer.  She has 6 children.  The youngest is 75.  MRI showed only a 2.4 x 2.3 cm mass in the upper outer right breast with overlying skin involvement.  There is an adjacent intramammary lymph node.  No other lymphadenopathy noted.  Pathology has sent her biopsy to [facility] as well as to [facility] for second opinions.  Those second opinions are still pending.  Today she is accompanied by her son.   Review of Systems: A complete review of systems was obtained from the  patient.  I have reviewed this information and discussed as appropriate with the patient.  See HPI as well for other ROS.  Review of Systems  Constitutional: Negative.   HENT: Negative.    Eyes: Negative.   Respiratory: Negative.    Cardiovascular: Negative.   Gastrointestinal: Negative.   Genitourinary: Negative.   Musculoskeletal: Negative.   Skin: Negative.   Neurological: Negative.   Endo/Heme/Allergies: Negative.   Psychiatric/Behavioral: Negative.        Medical History: Past Medical History:  Diagnosis Date   Anemia    Thyroid disease     Patient Active Problem List  Diagnosis   Sarcoma of chest wall (CMS-HCC)    History reviewed. No pertinent surgical history.    No Known Allergies  No current outpatient medications on file prior to visit.   No current facility-administered medications on file prior to visit.    History reviewed. No pertinent family history.   Social History   Tobacco Use  Smoking Status Never  Smokeless Tobacco Never     Social History   Socioeconomic History   Marital status: Married  Tobacco Use   Smoking status: Never   Smokeless tobacco: Never  Vaping Use   Vaping Use: Never used  Substance and Sexual Activity   Alcohol use: Never   Drug use: Never    Objective:    Vitals:   11/11/22 1123  PainSc: 0-No pain    There  is no height or weight on file to calculate BMI.  Physical Exam   Constitutional:  WDWN in NAD, conversant, no obvious deformities; lying in bed comfortably Eyes:  Pupils equal, round; sclera anicteric; moist conjunctiva; no lid lag HENT:  Oral mucosa moist; good dentition  Neck:  No masses palpated, trachea midline; no thyromegaly Lungs:  CTA bilaterally; normal respiratory effort Breasts:  symmetric, no nipple changes; no palpable masses or lymphadenopathy on left side;   The right breast shows no nipple changes.  No palpable masses within the breast tissue itself with the exception of the  extreme upper outer border of the breast where it meets the chest wall just anterior to the axilla.  There is a 1 cm dark purple pigmented area with a firm 2 cm mass underneath.  No drainage from this area.  No palpable lymphadenopathy in the adjacent right axilla.  The mass is firm and fairly well-demarcated. CV:  Regular rate and rhythm; no murmurs; extremities well-perfused with no edema Abd:  +bowel sounds, soft, non-tender, no palpable organomegaly; no palpable hernias Musc:  Unable to assess gait; no apparent clubbing or cyanosis in extremities Lymphatic:  No palpable cervical or axillary lymphadenopathy Skin:  Warm, dry; no sign of jaundice Psychiatric - alert and oriented x 4; calm mood and affect   Labs, Imaging and Diagnostic Testing: Diagnosis Breast, right, needle core biopsy, 11:00 10 cmfn POORLY DIFFERENTIATED MALIGNANCY. SEE NOTE. Diagnosis Note The neoplasm is characterized by sheets of cells with moderate to abundant eosinophilic cytoplasm, marked nuclear atypia and scattered mitotic figures. Immunohistochemistry is negative with cytokeratin AE1/AE3, cytokeratin 7, GATA3, Melan-A and CD45RO. Additional immunohistochemistry will be performed and reported as an addendum. The differential based on the morphologic features includes metaplastic carcinoma and high-grade sarcoma. Dr. Donneta Romberg agrees. [place] was notified on 10/17/2022. Claudette Laws MD Pathologist, Electronic Signature (Case signed 10/18/2022)  CLINICAL DATA:  Patient with poorly differentiated right breast malignancy.   EXAM: BILATERAL BREAST MRI WITH AND WITHOUT CONTRAST   TECHNIQUE: Multiplanar, multisequence MR images of both breasts were obtained prior to and following the intravenous administration of 6 ml of Vueway   Three-dimensional MR images were rendered by post-processing of the original MR data on an independent workstation. The three-dimensional MR images were interpreted,  and findings are reported in the following complete MRI report for this study. Three dimensional images were evaluated at the independent interpreting workstation using the DynaCAD thin client.   COMPARISON:  Previous exam(s).   FINDINGS: Breast composition: b. Scattered fibroglandular tissue.   Background parenchymal enhancement: Mild   Right breast: Within the upper-outer right breast there is a 2.4 x 2.3 cm oval circumscribed enhancing mass compatible with biopsy-proven malignancy. Mass has direct involvement of the overlying skin. No additional concerning areas of enhancement identified within the right breast.   Left breast: No mass or abnormal enhancement.   Lymph nodes: Normal bilateral axillary lymph nodes. Within the upper-outer quadrant of the right breast there is an intramammary lymph node (image 46; series 3) with suggestion of abnormal cortical thickening.   Ancillary findings:  None.   IMPRESSION: 1. Biopsy-proven malignancy within the upper-outer right breast with direct involvement of the overlying skin. 2. Within the upper-outer quadrant of the right breast, adjacent to the mass is an intramammary lymph node with suggestion of cortical thickening, indeterminate.   RECOMMENDATION: Second-look ultrasound to identify and evaluate the lymph node within the upper-outer quadrant of the right breast posterior depth. If this  demonstrates abnormal morphology on ultrasound, ultrasound-guided biopsy would be warranted.   Treatment plan for right breast malignancy.   BI-RADS CATEGORY  4: Suspicious.     Electronically Signed   By: Lovey Newcomer M.D.   On: 11/08/2022 12:03    CLINICAL DATA:  42 year old female presenting for evaluation of an enlarging right breast mass. Patient now has some associated tenderness.   EXAM: DIGITAL DIAGNOSTIC BILATERAL MAMMOGRAM WITH TOMOSYNTHESIS; ULTRASOUND RIGHT BREAST LIMITED   TECHNIQUE: Bilateral digital diagnostic  mammography and breast tomosynthesis was performed.; Targeted ultrasound examination of the right breast was performed   COMPARISON:  None available.   ACR Breast Density Category b: There are scattered areas of fibroglandular density.   FINDINGS: Mammogram:   Right breast: A skin BB marks the palpable site of concern reported by the patient in the upper outer right breast. A spot tangential view of this area was performed in addition to standard views. At the palpable site there is an oval circumscribed mass measuring approximately 2.5 cm which appears to extend into the skin. There are no additional suspicious findings in the right breast.   Left breast: No suspicious mass, distortion, or microcalcifications are identified to suggest presence of malignancy.   On physical exam at the site of concern reported by the patient in the upper outer right breast there is a reddish purple skin nodule.   Ultrasound:   Targeted ultrasound performed at the palpable site of concern at 11 o'clock 10 cm from the nipple demonstrating a superficial oval circumscribed hypoechoic mass measuring 2.2 x 1.3 x 1.7 cm, with extension into the skin. There is internal vascularity.   Targeted ultrasound of the right axilla demonstrates normal lymph nodes.   IMPRESSION: Indeterminate subdermal mass in the right breast at 11 o'clock measuring 2.2 cm.   RECOMMENDATION: Ultrasound-guided core needle biopsy x1 of the right breast.   I have discussed the findings and recommendations with the patient who agrees to proceed with biopsy. The patient will be scheduled for the biopsy appointment prior to leaving the office today.   BI-RADS CATEGORY  4: Suspicious.     Electronically Signed   By: Audie Pinto M.D.   On: 10/10/2022 16:16  Assessment and Plan:  Diagnoses and all orders for this visit:  Sarcoma of chest wall (CMS-HCC)   At this point, it is still unclear whether this malignancy  represents a sarcoma or an unusual breast cancer.  Rather than waiting for a final pathology report, I would like to move ahead with plans for wide local excision as well as a sentinel lymph node biopsy.  We will plan a right breast lumpectomy with excision of the overlying skin as well as a right axillary sentinel lymph node biopsy.  The surgical procedure has been discussed with the patient.  Potential risks, benefits, alternative treatments, and expected outcomes have been explained.  All of the patient's questions at this time have been answered.  The likelihood of reaching the patient's treatment goal is good.  The patient understand the proposed surgical procedure and wishes to proceed.     Denesia Donelan Jearld Adjutant, MD  11/11/2022 12:00 PM

## 2022-11-12 ENCOUNTER — Ambulatory Visit: Payer: Medicaid Other | Attending: Surgery

## 2022-11-12 ENCOUNTER — Other Ambulatory Visit: Payer: Self-pay

## 2022-11-12 DIAGNOSIS — C50411 Malignant neoplasm of upper-outer quadrant of right female breast: Secondary | ICD-10-CM | POA: Insufficient documentation

## 2022-11-12 DIAGNOSIS — R293 Abnormal posture: Secondary | ICD-10-CM | POA: Diagnosis not present

## 2022-11-15 ENCOUNTER — Other Ambulatory Visit: Payer: Self-pay

## 2022-11-15 ENCOUNTER — Encounter (HOSPITAL_BASED_OUTPATIENT_CLINIC_OR_DEPARTMENT_OTHER): Payer: Self-pay | Admitting: Surgery

## 2022-11-16 ENCOUNTER — Other Ambulatory Visit: Payer: No Typology Code available for payment source

## 2022-11-18 ENCOUNTER — Ambulatory Visit: Payer: Self-pay | Admitting: Licensed Clinical Social Worker

## 2022-11-18 ENCOUNTER — Encounter: Payer: Self-pay | Admitting: Licensed Clinical Social Worker

## 2022-11-18 ENCOUNTER — Telehealth: Payer: Self-pay | Admitting: Licensed Clinical Social Worker

## 2022-11-18 DIAGNOSIS — Z1379 Encounter for other screening for genetic and chromosomal anomalies: Secondary | ICD-10-CM

## 2022-11-18 NOTE — Progress Notes (Signed)

## 2022-11-18 NOTE — Telephone Encounter (Signed)
I contacted Ms. Aburto to discuss her genetic testing results. No pathogenic variants were identified in the 70 genes analyzed. Detailed clinic note to follow.   The test report has been scanned into EPIC and is located under the Molecular Pathology section of the Results Review tab.  A portion of the result report is included below for reference.      Faith Rogue, MS, [facility] Genetic Counselor [email or web address] Phone: [phone]

## 2022-11-18 NOTE — Progress Notes (Signed)
HPI:   [name] was previously seen in the [facility] clinic due to a personal history of cancer and concerns regarding a hereditary predisposition to cancer. Please refer to our prior cancer genetics clinic note for more information regarding our discussion, assessment and recommendations, at the time. [name] recent genetic test results were disclosed to her, as were recommendations warranted by these results. These results and recommendations are discussed in more detail below.  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 Targeted ultrasound performed at the palpable site of concern at 11o'clock 10 cm from the nipple demonstrating a superficial oval circumscribed hypoechoic mass measuring 2.2 x 1.3 x 1.7 cm, with extension into the skin. There is internal vascularity.   Targeted ultrasound of the right axilla demonstrates normal lymph nodes.   10/16/2022 Pathology Results   Pathology from the right breast needle core biopsy shows poorly differentiated malignancy.  The neoplasm is characterized by sheets of cells with moderate to abundant eosinophilic cytoplasm, marked nuclear atypia and scattered mitotic figures. Immunohistochemistry is negative with cytokeratin AE1/AE3, cytokeratin 7, GATA3, Melan-A and CD45RO. Additional immunohistochemistry will be performed and reported as an addendum. The differential based on the morphologic features includes metaplastic carcinoma and high-grade sarcoma.   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  Significant diagnoses are listed below: No family history on file.  [name] has 6 sisters, 2 brothers, 4 sons and 2 daughters. None have had cancer.  One of her sons had a benign brain tumor removed recently. No known family history of cancer.   [name] is unaware of previous family history of genetic testing for hereditary cancer risks. There is no reported Ashkenazi Jewish ancestry. There is no known consanguinity.     GENETIC TEST RESULTS:  The [facility] Multi-Cancer+RNA Panel found no pathogenic mutations.   The Multi-Cancer + RNA Panel offered by [facility] includes sequencing and/or deletion/duplication analysis of the following 70 genes:  AIP*, ALK, APC*, ATM*, AXIN2*, BAP1*, BARD1*, BLM*, BMPR1A*, BRCA1*, BRCA2*, BRIP1*, CDC73*, CDH1*, CDK4, CDKN1B*, CDKN2A, CHEK2*, CTNNA1*, DICER1*, EPCAM, EGFR, FH*, FLCN*, GREM1, HOXB13, KIT, LZTR1, MAX*, MBD4, MEN1*, MET, MITF, MLH1*, MSH2*, MSH3*, MSH6*, MUTYH*, NF1*, NF2*, NTHL1*, PALB2*, PDGFRA, PMS2*, POLD1*, POLE*, POT1*, PRKAR1A*, PTCH1*, PTEN*, RAD51C*, RAD51D*, RB1*, RET, SDHA*, SDHAF2*, SDHB*, SDHC*, SDHD*, SMAD4*, SMARCA4*, SMARCB1*, SMARCE1*, STK11*, SUFU*, TMEM127*, TP53*, TSC1*, TSC2*, VHL*. RNA analysis is performed for * genes.   The test report has been scanned into EPIC and is located under the Molecular Pathology section of the Results Review tab.  A portion of the result report is included below for reference. Genetic testing reported out on 11/16/2022.      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  At this time, it is unknown if this variant is associated with an increased risk for cancer or if it is benign, but most uncertain variants are reclassified to benign. It should not be used to make medical management decisions. With time, we suspect the laboratory will determine the significance of this variant, if any. If the laboratory reclassifies this variant, we will attempt to contact [name] to discuss it further.   Even though a pathogenic  variant was not identified, possible explanations for the cancer in the family  may include: There may be no hereditary risk for cancer in the family. The cancers in [name] and/or her family may be sporadic/familial or due to other genetic and environmental factors. There may be a gene mutation in one of these genes that current testing methods cannot detect but that chance is small. There could be another gene that has not yet been discovered, or that we have not yet tested, that is responsible for the cancer diagnoses in the family.  It is also possible there is a hereditary cause for the cancer in the family that [name] did not inherit  Therefore, it is important to remain in touch with cancer genetics in the future so that we can continue to offer [name] the most up to date genetic testing.   ADDITIONAL GENETIC TESTING:  We discussed with [name] that her genetic testing was fairly extensive.  If there are additional relevant genes identified to increase cancer risk that can be analyzed in the future, we would be happy to discuss and coordinate this testing at that time.    CANCER SCREENING RECOMMENDATIONS:  [name] test result is considered negative (normal).  This means that we have not identified a hereditary cause for her personal history of cancer at this time.   An individual's cancer risk and medical management are not determined by genetic test results alone. Overall cancer risk assessment incorporates additional factors, including personal medical history, family history, and any available genetic information that may result in a personalized plan for cancer prevention and surveillance. Therefore, it is recommended she continue to follow the cancer management and screening guidelines provided by her oncology and primary healthcare provider.  RECOMMENDATIONS FOR FAMILY MEMBERS:   Since she did not inherit a identifiable mutation in a cancer predisposition gene included on this panel, her children could not have inherited a known mutation  from her in one of these genes. Individuals in this family might be at some increased risk of developing cancer, over the general population risk, due to the family history of cancer.  Individuals in the family should notify their providers of the family history of cancer. We recommend women in this family have a yearly mammogram beginning at age 64, or 27 years younger than the earliest onset of cancer, an annual clinical breast exam, and perform monthly breast self-exams.  Family members should have colonoscopies by at age 61, or earlier, as recommended by their providers. We do not recommend familial testing for the ALK variant of uncertain significance (VUS).  FOLLOW-UP:  Lastly, we discussed with [name] that cancer genetics is a rapidly advancing field and it is possible that new genetic tests will be appropriate for her and/or her family members in the future. We encouraged her to remain in contact with cancer genetics on an annual basis so we can update her personal and family histories and let her know of advances in cancer genetics that may benefit this family.   Our contact number was provided. [name] questions were answered to her satisfaction, and she knows she is welcome to call us at anytime with additional questions or concerns.    Faith Rogue, MS, [facility] Genetic Counselor [email or web address] Phone: [phone]

## 2022-11-19 ENCOUNTER — Telehealth: Payer: Self-pay | Admitting: Licensed Clinical Social Worker

## 2022-11-19 NOTE — Telephone Encounter (Signed)
Patient and son came to [facility] yesterday afternoon to request updated letter stating pt's medical urgency / need to have her parents be approved for a visa to assist her post-surgery and during treatment.  Pt's son emailed request and CSW has shared this with Dr. Chryl Heck. Also recommended to family that they request this from surgeon's office as that is pt's first step in treatment.   Karen Berry E Karen Sollars, LCSW

## 2022-11-20 NOTE — Anesthesia Preprocedure Evaluation (Signed)
Anesthesia Evaluation  Patient identified by MRN, date of birth, ID band Patient awake    Reviewed: Allergy & Precautions, H&P , NPO status , Patient's Chart, lab work & pertinent test results  Airway Mallampati: II  TM Distance: >3 FB Neck ROM: Full    Dental no notable dental hx. (+) Loose,    Pulmonary neg pulmonary ROS   Pulmonary exam normal breath sounds clear to auscultation       Cardiovascular Exercise Tolerance: Good negative cardio ROS  Rhythm:Regular Rate:Normal     Neuro/Psych negative neurological ROS  negative psych ROS   GI/Hepatic negative GI ROS, Neg liver ROS,,,  Endo/Other  negative endocrine ROS    Renal/GU negative Renal ROS  negative genitourinary   Musculoskeletal   Abdominal   Peds  Hematology  (+) Blood dyscrasia, anemia   Anesthesia Other Findings   Reproductive/Obstetrics negative OB ROS                             Anesthesia Physical Anesthesia Plan  ASA: 2  Anesthesia Plan: General   Post-op Pain Management: Regional block*, Tylenol PO (pre-op)* and Toradol IV (intra-op)*   Induction: Intravenous  PONV Risk Score and Plan: 4 or greater and Ondansetron, Dexamethasone, Propofol infusion, TIVA and Midazolam  Airway Management Planned: LMA  Additional Equipment:   Intra-op Plan:   Post-operative Plan: Extubation in OR  Informed Consent: I have reviewed the patients History and Physical, chart, labs and discussed the procedure including the risks, benefits and alternatives for the proposed anesthesia with the patient or authorized representative who has indicated his/her understanding and acceptance.     Dental advisory given  Plan Discussed with: CRNA  Anesthesia Plan Comments:        Anesthesia Quick Evaluation

## 2022-11-21 ENCOUNTER — Ambulatory Visit (HOSPITAL_COMMUNITY)
Admission: RE | Admit: 2022-11-21 | Discharge: 2022-11-21 | Disposition: A | Discharge status: Home / Self Care | Payer: Medicaid Other | Source: Ambulatory Visit | Attending: Surgery | Admitting: Surgery

## 2022-11-21 ENCOUNTER — Other Ambulatory Visit: Payer: Self-pay

## 2022-11-21 ENCOUNTER — Ambulatory Visit (HOSPITAL_BASED_OUTPATIENT_CLINIC_OR_DEPARTMENT_OTHER)
Admission: RE | Admit: 2022-11-21 | Discharge: 2022-11-21 | Disposition: A | Discharge status: Home / Self Care | Payer: Medicaid Other | Attending: Surgery | Admitting: Surgery

## 2022-11-21 ENCOUNTER — Encounter (HOSPITAL_BASED_OUTPATIENT_CLINIC_OR_DEPARTMENT_OTHER): Payer: Self-pay | Admitting: Surgery

## 2022-11-21 ENCOUNTER — Ambulatory Visit (HOSPITAL_BASED_OUTPATIENT_CLINIC_OR_DEPARTMENT_OTHER): Payer: Medicaid Other | Admitting: Anesthesiology

## 2022-11-21 ENCOUNTER — Encounter (HOSPITAL_BASED_OUTPATIENT_CLINIC_OR_DEPARTMENT_OTHER)
Admission: RE | Disposition: A | Discharge status: Home / Self Care | Payer: Self-pay | Source: Home / Self Care | Attending: Surgery

## 2022-11-21 DIAGNOSIS — Z170 Estrogen receptor positive status [ER+]: Secondary | ICD-10-CM | POA: Diagnosis not present

## 2022-11-21 DIAGNOSIS — C50411 Malignant neoplasm of upper-outer quadrant of right female breast: Secondary | ICD-10-CM

## 2022-11-21 DIAGNOSIS — N6310 Unspecified lump in the right breast, unspecified quadrant: Secondary | ICD-10-CM | POA: Diagnosis not present

## 2022-11-21 DIAGNOSIS — Z01818 Encounter for other preprocedural examination: Secondary | ICD-10-CM

## 2022-11-21 DIAGNOSIS — C493 Malignant neoplasm of connective and soft tissue of thorax: Secondary | ICD-10-CM

## 2022-11-21 HISTORY — PX: BREAST LUMPECTOMY WITH AXILLARY LYMPH NODE BIOPSY: SHX5593

## 2022-11-21 LAB — POCT PREGNANCY, URINE: Preg Test, Ur: NEGATIVE

## 2022-11-21 SURGERY — BREAST LUMPECTOMY WITH AXILLARY LYMPH NODE BIOPSY
Anesthesia: General | Site: Breast | Laterality: Right

## 2022-11-21 MED ORDER — BUPIVACAINE-EPINEPHRINE (PF) 0.25% -1:200000 IJ SOLN
INTRAMUSCULAR | Status: AC
  Filled 2022-11-21: qty 30

## 2022-11-21 MED ORDER — BUPIVACAINE-EPINEPHRINE (PF) 0.5% -1:200000 IJ SOLN
INTRAMUSCULAR | Status: DC | PRN
  Administered 2022-11-21: 20 mL via PERINEURAL

## 2022-11-21 MED ORDER — DEXAMETHASONE SODIUM PHOSPHATE 10 MG/ML IJ SOLN
INTRAMUSCULAR | Status: AC
  Filled 2022-11-21: qty 1

## 2022-11-21 MED ORDER — FENTANYL CITRATE (PF) 100 MCG/2ML IJ SOLN
INTRAMUSCULAR | Status: AC
  Filled 2022-11-21: qty 2

## 2022-11-21 MED ORDER — TECHNETIUM TC 99M TILMANOCEPT KIT
1.0000 | PACK | Freq: Once | INTRAVENOUS | Status: AC | PRN
  Administered 2022-11-21: 1 via INTRADERMAL

## 2022-11-21 MED ORDER — MIDAZOLAM HCL 2 MG/2ML IJ SOLN
2.0000 mg | Freq: Once | INTRAMUSCULAR | Status: AC
  Administered 2022-11-21: 2 mg via INTRAVENOUS

## 2022-11-21 MED ORDER — ACETAMINOPHEN 500 MG PO TABS: 1000.0000 mg | ORAL_TABLET | ORAL | Status: DC

## 2022-11-21 MED ORDER — LACTATED RINGERS IV SOLN: INTRAVENOUS | Status: DC

## 2022-11-21 MED ORDER — HYDROMORPHONE HCL 1 MG/ML IJ SOLN: 0.2500 mg | INTRAMUSCULAR | Status: DC | PRN

## 2022-11-21 MED ORDER — BUPIVACAINE LIPOSOME 1.3 % IJ SUSP
INTRAMUSCULAR | Status: DC | PRN
  Administered 2022-11-21: 10 mL via PERINEURAL

## 2022-11-21 MED ORDER — LIDOCAINE 2% (20 MG/ML) 5 ML SYRINGE
INTRAMUSCULAR | Status: AC
  Filled 2022-11-21: qty 5

## 2022-11-21 MED ORDER — ONDANSETRON HCL 4 MG/2ML IJ SOLN
INTRAMUSCULAR | Status: AC
  Filled 2022-11-21: qty 2

## 2022-11-21 MED ORDER — FENTANYL CITRATE (PF) 100 MCG/2ML IJ SOLN
INTRAMUSCULAR | Status: DC | PRN
  Administered 2022-11-21: 25 ug via INTRAVENOUS

## 2022-11-21 MED ORDER — 0.9 % SODIUM CHLORIDE (POUR BTL) OPTIME
TOPICAL | Status: DC | PRN
  Administered 2022-11-21: 100 mL

## 2022-11-21 MED ORDER — ACETAMINOPHEN 500 MG PO TABS
1000.0000 mg | ORAL_TABLET | Freq: Once | ORAL | Status: AC
  Administered 2022-11-21: 1000 mg via ORAL

## 2022-11-21 MED ORDER — MIDAZOLAM HCL 2 MG/2ML IJ SOLN
INTRAMUSCULAR | Status: AC
  Filled 2022-11-21: qty 2

## 2022-11-21 MED ORDER — OXYCODONE HCL 5 MG PO TABS: 5.0000 mg | ORAL_TABLET | Freq: Four times a day (QID) | ORAL | 0 refills | Status: AC | PRN

## 2022-11-21 MED ORDER — CHLORHEXIDINE GLUCONATE CLOTH 2 % EX PADS: 6.0000 | MEDICATED_PAD | Freq: Once | CUTANEOUS | Status: DC

## 2022-11-21 MED ORDER — EPHEDRINE SULFATE (PRESSORS) 50 MG/ML IJ SOLN
INTRAMUSCULAR | Status: DC | PRN
  Administered 2022-11-21 (×2): 5 mg via INTRAVENOUS

## 2022-11-21 MED ORDER — DEXAMETHASONE SODIUM PHOSPHATE 4 MG/ML IJ SOLN
INTRAMUSCULAR | Status: DC | PRN
  Administered 2022-11-21: 10 mg via INTRAVENOUS

## 2022-11-21 MED ORDER — ACETAMINOPHEN 500 MG PO TABS
ORAL_TABLET | ORAL | Status: AC
  Filled 2022-11-21: qty 2

## 2022-11-21 MED ORDER — CEFAZOLIN SODIUM-DEXTROSE 2-4 GM/100ML-% IV SOLN
INTRAVENOUS | Status: AC
  Filled 2022-11-21: qty 100

## 2022-11-21 MED ORDER — BUPIVACAINE-EPINEPHRINE 0.25% -1:200000 IJ SOLN
INTRAMUSCULAR | Status: DC | PRN
  Administered 2022-11-21: 10 mL

## 2022-11-21 MED ORDER — PROPOFOL 10 MG/ML IV BOLUS
INTRAVENOUS | Status: DC | PRN
  Administered 2022-11-21: 150 mg via INTRAVENOUS

## 2022-11-21 MED ORDER — CEFAZOLIN SODIUM-DEXTROSE 2-4 GM/100ML-% IV SOLN
2.0000 g | INTRAVENOUS | Status: AC
  Administered 2022-11-21: 2 g via INTRAVENOUS

## 2022-11-21 MED ORDER — FENTANYL CITRATE (PF) 100 MCG/2ML IJ SOLN
100.0000 ug | Freq: Once | INTRAMUSCULAR | Status: AC
  Administered 2022-11-21: 100 ug via INTRAVENOUS

## 2022-11-21 MED ORDER — LIDOCAINE HCL (CARDIAC) PF 100 MG/5ML IV SOSY
PREFILLED_SYRINGE | INTRAVENOUS | Status: DC | PRN
  Administered 2022-11-21: 40 mg via INTRAVENOUS

## 2022-11-21 MED ORDER — PROPOFOL 500 MG/50ML IV EMUL
INTRAVENOUS | Status: AC
  Filled 2022-11-21: qty 50

## 2022-11-21 MED ORDER — ONDANSETRON HCL 4 MG/2ML IJ SOLN
INTRAMUSCULAR | Status: DC | PRN
  Administered 2022-11-21: 4 mg via INTRAVENOUS

## 2022-11-21 SURGICAL SUPPLY — 59 items
APL PRP STRL LF DISP 70% ISPRP (MISCELLANEOUS) ×1
APL SKNCLS STERI-STRIP NONHPOA (GAUZE/BANDAGES/DRESSINGS) ×1
APPLIER CLIP 9.375 MED OPEN (MISCELLANEOUS) ×1
APR CLP MED 9.3 20 MLT OPN (MISCELLANEOUS) ×1
BENZOIN TINCTURE PRP APPL 2/3 (GAUZE/BANDAGES/DRESSINGS) ×1 IMPLANT
BINDER BREAST 3XL (GAUZE/BANDAGES/DRESSINGS) IMPLANT
BINDER BREAST LRG (GAUZE/BANDAGES/DRESSINGS) IMPLANT
BINDER BREAST MEDIUM (GAUZE/BANDAGES/DRESSINGS) IMPLANT
BINDER BREAST XLRG (GAUZE/BANDAGES/DRESSINGS) IMPLANT
BINDER BREAST XXLRG (GAUZE/BANDAGES/DRESSINGS) IMPLANT
BLADE CLIPPER SURG (BLADE) IMPLANT
BLADE HEX COATED 2.75 (ELECTRODE) ×1 IMPLANT
BLADE SURG 10 STRL SS (BLADE) IMPLANT
BLADE SURG 15 STRL LF DISP TIS (BLADE) ×1 IMPLANT
BLADE SURG 15 STRL SS (BLADE) ×1
CANISTER SUC SOCK COL 7IN (MISCELLANEOUS) ×1 IMPLANT
CANISTER SUCT 1200ML W/VALVE (MISCELLANEOUS) IMPLANT
CHLORAPREP W/TINT 26 (MISCELLANEOUS) ×1 IMPLANT
CLIP APPLIE 9.375 MED OPEN (MISCELLANEOUS) IMPLANT
COVER BACK TABLE 60X90IN (DRAPES) ×1 IMPLANT
COVER MAYO STAND STRL (DRAPES) ×1 IMPLANT
COVER PROBE CYLINDRICAL 5X96 (MISCELLANEOUS) ×1 IMPLANT
DRAPE LAPAROSCOPIC ABDOMINAL (DRAPES) ×1 IMPLANT
DRAPE UTILITY XL STRL (DRAPES) ×1 IMPLANT
DRSG TEGADERM 4X4.75 (GAUZE/BANDAGES/DRESSINGS) ×2 IMPLANT
ELECT REM PT RETURN 9FT ADLT (ELECTROSURGICAL) ×1
ELECTRODE REM PT RTRN 9FT ADLT (ELECTROSURGICAL) ×1 IMPLANT
GAUZE SPONGE 4X4 12PLY STRL LF (GAUZE/BANDAGES/DRESSINGS) IMPLANT
GLOVE BIO SURGEON STRL SZ7 (GLOVE) ×1 IMPLANT
GLOVE BIOGEL PI IND STRL 7.0 (GLOVE) IMPLANT
GLOVE BIOGEL PI IND STRL 7.5 (GLOVE) ×1 IMPLANT
GLOVE SURG SS PI 7.0 STRL IVOR (GLOVE) IMPLANT
GOWN STRL REUS W/ TWL LRG LVL3 (GOWN DISPOSABLE) ×2 IMPLANT
GOWN STRL REUS W/ TWL XL LVL3 (GOWN DISPOSABLE) IMPLANT
GOWN STRL REUS W/TWL LRG LVL3 (GOWN DISPOSABLE) ×1
GOWN STRL REUS W/TWL XL LVL3 (GOWN DISPOSABLE) ×1
KIT MARKER MARGIN INK (KITS) ×1 IMPLANT
NDL HYPO 25X1 1.5 SAFETY (NEEDLE) ×2 IMPLANT
NDL SAFETY ECLIP 18X1.5 (MISCELLANEOUS) ×1 IMPLANT
NEEDLE HYPO 25X1 1.5 SAFETY (NEEDLE) ×1 IMPLANT
NS IRRIG 1000ML POUR BTL (IV SOLUTION) IMPLANT
PACK BASIN DAY SURGERY FS (CUSTOM PROCEDURE TRAY) ×1 IMPLANT
PENCIL SMOKE EVACUATOR (MISCELLANEOUS) ×1 IMPLANT
SLEEVE SCD COMPRESS KNEE MED (STOCKING) ×1 IMPLANT
SPIKE FLUID TRANSFER (MISCELLANEOUS) IMPLANT
SPONGE T-LAP 18X18 ~~LOC~~+RFID (SPONGE) IMPLANT
SPONGE T-LAP 4X18 ~~LOC~~+RFID (SPONGE) ×1 IMPLANT
STRIP CLOSURE SKIN 1/2X4 (GAUZE/BANDAGES/DRESSINGS) ×1 IMPLANT
SUT MON AB 4-0 PC3 18 (SUTURE) ×1 IMPLANT
SUT SILK 2 0 SH (SUTURE) IMPLANT
SUT VIC AB 3-0 SH 27 (SUTURE) ×2
SUT VIC AB 3-0 SH 27X BRD (SUTURE) ×1 IMPLANT
SYR BULB EAR ULCER 3OZ GRN STR (SYRINGE) ×1 IMPLANT
SYR CONTROL 10ML LL (SYRINGE) ×2 IMPLANT
TOWEL GREEN STERILE FF (TOWEL DISPOSABLE) ×1 IMPLANT
TRACER MAGTRACE VIAL (MISCELLANEOUS) IMPLANT
TRAY FAXITRON CT DISP (TRAY / TRAY PROCEDURE) ×1 IMPLANT
TUBE CONNECTING 20X1/4 (TUBING) ×1 IMPLANT
YANKAUER SUCT BULB TIP NO VENT (SUCTIONS) IMPLANT

## 2022-11-21 NOTE — Discharge Instructions (Addendum)
[place] Office Phone Number [phone]  BREAST BIOPSY/ PARTIAL MASTECTOMY: POST OP INSTRUCTIONS  Always review your discharge instruction sheet given to you by the facility where your surgery was performed.  IF YOU HAVE DISABILITY OR FAMILY LEAVE FORMS, YOU MUST BRING THEM TO THE OFFICE FOR PROCESSING.  DO NOT GIVE THEM TO YOUR DOCTOR.  A prescription for pain medication may be given to you upon discharge.  Take your pain medication as prescribed, if needed.  If narcotic pain medicine is not needed, then you may take acetaminophen (Tylenol) or ibuprofen (Advil) as needed. No Tylenol until after 1:00pm today, if needed. Take your usually prescribed medications unless otherwise directed If you need a refill on your pain medication, please contact your pharmacy.  They will contact our office to request authorization.  Prescriptions will not be filled after 5pm or on week-ends. You should eat very light the first 24 hours after surgery, such as soup, crackers, pudding, etc.  Resume your normal diet the day after surgery. Most patients will experience some swelling and bruising in the breast.  Ice packs and a good support bra will help.  Swelling and bruising can take several days to resolve.  It is common to experience some constipation if taking pain medication after surgery.  Increasing fluid intake and taking a stool softener will usually help or prevent this problem from occurring.  A mild laxative (Milk of Magnesia or Miralax) should be taken according to package directions if there are no bowel movements after 48 hours. Unless discharge instructions indicate otherwise, you may remove your bandages 24-48 hours after surgery, and you may shower at that time.  You may have steri-strips (small skin tapes) in place directly over the incision.  These strips should be left on the skin for 7-10 days.  If your surgeon used skin glue on the incision, you may shower in 24 hours.  The glue  will flake off over the next 2-3 weeks.  Any sutures or staples will be removed at the office during your follow-up visit. ACTIVITIES:  You may resume regular daily activities (gradually increasing) beginning the next day.  Wearing a good support bra or sports bra minimizes pain and swelling.  You may have sexual intercourse when it is comfortable. You may drive when you no longer are taking prescription pain medication, you can comfortably wear a seatbelt, and you can safely maneuver your car and apply brakes. RETURN TO WORK:  ______________________________________________________________________________________ Dennis Bast should see your doctor in the office for a follow-up appointment approximately two weeks after your surgery.  Your doctor's nurse will typically make your follow-up appointment when she calls you with your pathology report.  Expect your pathology report 2-3 business days after your surgery.  You may call to check if you do not hear from [place] after three days. OTHER INSTRUCTIONS: _______________________________________________________________________________________________ _____________________________________________________________________________________________________________________________________ _____________________________________________________________________________________________________________________________________ _____________________________________________________________________________________________________________________________________  WHEN TO CALL YOUR DOCTOR: Fever over 101.0 Nausea and/or vomiting. Extreme swelling or bruising. Continued bleeding from incision. Increased pain, redness, or drainage from the incision.  The clinic staff is available to answer your questions during regular business hours.  Please don't hesitate to call and ask to speak to one of the nurses for clinical concerns.  If you have a medical emergency, go to the nearest emergency  room or call 911.  A surgeon from [facility] is always on call at the hospital.  For further questions, please visit [email or web address]     Post Anesthesia Home Care Instructions  Activity: Get plenty of rest for the remainder of the day. A responsible individual must stay with you for 24 hours following the procedure.  For the next 24 hours, DO NOT: -Drive a car -Paediatric nurse -Drink alcoholic beverages -Take any medication unless instructed by your physician -Make any legal decisions or sign important papers.  Meals: Start with liquid foods such as gelatin or soup. Progress to regular foods as tolerated. Avoid greasy, spicy, heavy foods. If nausea and/or vomiting occur, drink only clear liquids until the nausea and/or vomiting subsides. Call your physician if vomiting continues.  Special Instructions/Symptoms: Your throat may feel dry or sore from the anesthesia or the breathing tube placed in your throat during surgery. If this causes discomfort, gargle with warm salt water. The discomfort should disappear within 24 hours.  If you had a scopolamine patch placed behind your ear for the management of post- operative nausea and/or vomiting:  1. The medication in the patch is effective for 72 hours, after which it should be removed.  Wrap patch in a tissue and discard in the trash. Wash hands thoroughly with soap and water. 2. You may remove the patch earlier than 72 hours if you experience unpleasant side effects which may include dry mouth, dizziness or visual disturbances. 3. Avoid touching the patch. Wash your hands with soap and water after contact with the patch.   Information for Discharge Teaching: EXPAREL (bupivacaine liposome injectable suspension)   Your surgeon or anesthesiologist gave you EXPAREL(bupivacaine) to help control your pain after surgery.  EXPAREL is a local anesthetic that provides pain relief by numbing the tissue around the surgical  site. EXPAREL is designed to release pain medication over time and can control pain for up to 72 hours. Depending on how you respond to EXPAREL, you may require less pain medication during your recovery.  Possible side effects: Temporary loss of sensation or ability to move in the area where bupivacaine was injected. Nausea, vomiting, constipation Rarely, numbness and tingling in your mouth or lips, lightheadedness, or anxiety may occur. Call your doctor right away if you think you may be experiencing any of these sensations, or if you have other questions regarding possible side effects.  Follow all other discharge instructions given to you by your surgeon or nurse. Eat a healthy diet and drink plenty of water or other fluids.  If you return to the hospital for any reason within 96 hours following the administration of EXPAREL, it is important for health care providers to know that you have received this anesthetic. A teal colored band has been placed on your arm with the date, time and amount of EXPAREL you have received in order to alert and inform your health care providers. Please leave this armband in place for the full 96 hours following administration, and then you may remove the band.

## 2022-11-21 NOTE — Transfer of Care (Addendum)
Immediate Anesthesia Transfer of Care Note  Patient: Nurse, learning disability  Procedure(s) Performed: RIGHT BREAST LUMPECTOMY WITH AXILLARY LYMPH NODE BIOPSY (Right: Breast)  Patient Location: GA combined with regional for post-op pain  Anesthesia Type:GA combined with regional for post-op pain  Level of Consciousness: sedated  Airway & Oxygen Therapy: Patient Spontanous Breathing and Patient connected to face mask oxygen  Post-op Assessment: Report given to RN and Post -op Vital signs reviewed and stable  Post vital signs: Reviewed and stable  Last Vitals:  Vitals Value Taken Time  BP 115/84 11/21/22 1026  Temp 36.6 C 11/21/22 1026  Pulse 72 11/21/22 1026  Resp 14 11/21/22 1026  SpO2 97 % 11/21/22 1026    Last Pain:  Vitals:   11/21/22 1009  TempSrc:   PainSc: 1       Patients Stated Pain Goal: 8 ([id number])  Complications: No notable events documented.

## 2022-11-21 NOTE — Anesthesia Procedure Notes (Signed)
Procedure Name: LMA Insertion Date/Time: 11/21/2022 8:25 AM  Performed by: Maryella Shivers, CRNAPre-anesthesia Checklist: Patient identified, Emergency Drugs available, Suction available and Patient being monitored Patient Re-evaluated:Patient Re-evaluated prior to induction Oxygen Delivery Method: Circle system utilized Preoxygenation: Pre-oxygenation with 100% oxygen Induction Type: IV induction Ventilation: Mask ventilation without difficulty LMA: LMA inserted LMA Size: 4.0 Number of attempts: 1 Airway Equipment and Method: Bite block Placement Confirmation: positive ETCO2 Tube secured with: Tape Dental Injury: Teeth and Oropharynx as per pre-operative assessment

## 2022-11-21 NOTE — Interval H&P Note (Signed)
History and Physical Interval Note:  11/21/2022 8:05 AM  Karen Berry  has presented today for surgery, with the diagnosis of RIGHT BREAST MALIGNANCY.  The various methods of treatment have been discussed with the patient and family. After consideration of risks, benefits and other options for treatment, the patient has consented to  Procedure(s): RIGHT BREAST LUMPECTOMY WITH AXILLARY LYMPH NODE BIOPSY (Right) as a surgical intervention.  The patient's history has been reviewed, patient examined, no change in status, stable for surgery.  I have reviewed the patient's chart and labs.  Questions were answered to the patient's satisfaction.     Maia Petties

## 2022-11-21 NOTE — Anesthesia Procedure Notes (Signed)
Anesthesia Regional Block: Pectoralis block   Pre-Anesthetic Checklist: , timeout performed,  Correct Patient, Correct Site, Correct Laterality,  Correct Procedure, Correct Position, site marked,  Risks and benefits discussed,  Pre-op evaluation,  At surgeon's request and post-op pain management  Laterality: Right  Prep: Maximum Sterile Barrier Precautions used, chloraprep       Needles:  Injection technique: Single-shot  Needle Type: Echogenic Stimulator Needle     Needle Length: 9cm  Needle Gauge: 21     Additional Needles:   Procedures:,,,, ultrasound used (permanent image in chart),,    Narrative:  Start time: 11/21/2022 7:04 AM End time: 11/21/2022 7:14 AM Injection made incrementally with aspirations every 5 mL.  Performed by: Personally  Anesthesiologist: Roderic Palau, MD  Additional Notes: 2% Lidocaine skin wheel.

## 2022-11-21 NOTE — Anesthesia Postprocedure Evaluation (Signed)
Anesthesia Post Note  Patient: Nurse, learning disability  Procedure(s) Performed: RIGHT BREAST LUMPECTOMY WITH AXILLARY LYMPH NODE BIOPSY (Right: Breast)     Patient location during evaluation: PACU Anesthesia Type: General and Regional Level of consciousness: awake and alert Pain management: pain level controlled Vital Signs Assessment: post-procedure vital signs reviewed and stable Respiratory status: spontaneous breathing, nonlabored ventilation and respiratory function stable Cardiovascular status: blood pressure returned to baseline and stable Postop Assessment: no apparent nausea or vomiting Anesthetic complications: no  No notable events documented.  Last Vitals:  Vitals:   11/21/22 0945 11/21/22 1000  BP: 113/82 110/80  Pulse: 79 69  Resp: 16 12  Temp:    SpO2: 97% 97%    Last Pain:  Vitals:   11/21/22 0945  TempSrc:   PainSc: 0-No pain                 Atziri Zubiate,W. EDMOND

## 2022-11-21 NOTE — Progress Notes (Signed)
Assisted Dr. Edmond Fitzgerald with right, pectoralis, ultrasound guided block. Side rails up, monitors on throughout procedure. See vital signs in flow sheet. Tolerated Procedure well. ?

## 2022-11-21 NOTE — Op Note (Signed)
Pre-op diagnosis: Right breast mass/malignancy Postop diagnosis: Same Procedure performed: Right breast lumpectomy with sentinel lymph node biopsy Surgeon:Luccia Reinheimer K Shikha Bibb Anesthesia: General via LMA with pectoral block Indications:This is a 42 year old female from [place] who presents with a small skin lesion in the right upper outer quadrant of her breast for at least a year.  Initially this area was nonpigmented.  Over the last 4 months it has grown in size and has become dark purple.  It has also become painful.  About 4 months ago she also noticed a palpable mass underneath the skin lesion.   She sought medical attention for this area and was first seen on 10/10/2022 by the nurse practitioner for the [facility] program.  Imaging was done urgently that revealed a 2.2 x 1.3 x 1.7 cm hypoechoic mass located at 11:00 in the right breast 10 cm from the nipple.  This extends into the skin.  There is internal vascularity.  Axillary ultrasound showed normal lymph nodes.  She underwent biopsy on 10/16/2022.  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  Pathology has sent her biopsy to [facility] as well as to [facility] for second opinions.  Those second opinions are still pending.   The patient has no family history of breast cancer.     MRI showed only a 2.4 x 2.3 cm mass in the upper outer right breast with overlying skin involvement.  There is an adjacent intramammary lymph node.  No other lymphadenopathy noted.  Description of procedure: The patient received a pec block as well as injection with technetium sulfur colloid in the preop area.  She was brought to the operating room and placed in the supine position on the operating room table.  After an adequate level general anesthesia was obtained, her right chest and axilla were prepped with ChloraPrep and draped sterile fashion.  A timeout was taken to ensure the proper  patient and proper procedure.  The mass is easily visible and palpable in the right upper outer quadrant of the breast almost at the edge of the breast tissue.  The mass is palpated at 2-1/2 cm.  We interrogated the axilla with the neoprobe.  There is minimal activity in the axilla but there seems to be some activity around the mass itself.  We infiltrated around the mass with local anesthetic.  I have made an elliptical incision that is about 6 cm long.  I took 2 cm margins around the visible skin changes.  We dissected down into the subcutaneous tissue.  I took wide margins around the palpable mass.  The specimen was oriented with a paint kit and sent for pathologic examination.  I then examined the wound.  There is a visible intramammary lymph node that is identified.  This lymph node shows increased activity with the neoprobe.  We excised this mass and labeled intramammary lymph node/sentinel lymph node.  This was sent for pathologic examination.  We then again interrogated the wound as well as the axilla.  Minimal background activity was noted.  We irrigated the wound thoroughly and inspected for hemostasis.  The wound was closed with a deep layer of 3-0 Vicryl.  4-0 Monocryl was used to close the skin.  Benzoin and Steri-Strips were applied.  A clean dressing is applied.  The patient was then extubated and brought to the recovery room in stable condition.  All sponge, instrument, and needle counts are correct.  Imogene Burn.  Georgette Dover, MD, [facility]  General Surgery   11/21/2022 9:18 AM

## 2022-11-21 NOTE — Progress Notes (Signed)
Nuc med inj performed by nuc med staff. No additional sedation/meds given. Pt tol well, husband at bedside

## 2022-11-22 ENCOUNTER — Encounter (HOSPITAL_BASED_OUTPATIENT_CLINIC_OR_DEPARTMENT_OTHER): Payer: Self-pay | Admitting: Surgery

## 2022-11-25 ENCOUNTER — Ambulatory Visit (HOSPITAL_COMMUNITY)
Admission: RE | Admit: 2022-11-25 | Discharge: 2022-11-25 | Disposition: A | Discharge status: Home / Self Care | Payer: Medicaid Other | Source: Ambulatory Visit | Attending: Hematology and Oncology | Admitting: Hematology and Oncology

## 2022-11-25 DIAGNOSIS — C50411 Malignant neoplasm of upper-outer quadrant of right female breast: Secondary | ICD-10-CM | POA: Diagnosis present

## 2022-11-25 LAB — GLUCOSE, CAPILLARY: Glucose-Capillary: 103 mg/dL — ABNORMAL HIGH (ref 70–99)

## 2022-11-25 MED ORDER — FLUDEOXYGLUCOSE F - 18 (FDG) INJECTION
9.0000 | Freq: Once | INTRAVENOUS | Status: AC | PRN
  Administered 2022-11-25: 7.23 via INTRAVENOUS

## 2022-11-28 ENCOUNTER — Telehealth: Payer: Self-pay | Admitting: Hematology and Oncology

## 2022-11-28 ENCOUNTER — Encounter: Payer: Self-pay | Admitting: *Deleted

## 2022-11-28 ENCOUNTER — Inpatient Hospital Stay: Payer: Medicaid Other | Admitting: Hematology and Oncology

## 2022-11-28 NOTE — Telephone Encounter (Signed)
Spoke with patient confirming upcoming appointment  

## 2022-12-02 ENCOUNTER — Inpatient Hospital Stay: Payer: Medicaid Other | Admitting: Hematology and Oncology

## 2022-12-10 ENCOUNTER — Encounter (HOSPITAL_COMMUNITY): Payer: Self-pay

## 2022-12-11 NOTE — Therapy (Signed)
OUTPATIENT PHYSICAL THERAPY BREAST CANCER POST OP FOLLOW UP   Patient Name: Karen Berry MRN: [id number] DOB:[birth year], 42 y.o., female Today's Date: 12/12/2022  END OF SESSION:  PT End of Session - 12/12/22 0911     Visit Number 2    Number of Visits 2    Date for PT Re-Evaluation 12/12/22    PT Start Time 0912   LATE   PT Stop Time 0950    PT Time Calculation (min) 38 min    Activity Tolerance Patient tolerated treatment well    Behavior During Therapy [facility] for tasks assessed/performed             Past Medical History:  Diagnosis Date   Anemia    Thyroid disease    Past Surgical History:  Procedure Laterality Date   BREAST BIOPSY Right 10/16/2022   [place] RT BREAST BX W LOC DEV 1ST LESION IMG BX SPEC US GUIDE 10/16/2022 [facility] MAMMOGRAPHY   BREAST LUMPECTOMY WITH AXILLARY LYMPH NODE BIOPSY Right 11/21/2022   Procedure: RIGHT BREAST LUMPECTOMY WITH AXILLARY LYMPH NODE BIOPSY;  Surgeon: Donnie Mesa, MD;  Location: [place];  Service: General;  Laterality: Right;   female circumcision     NO PAST SURGERIES     Patient Active Problem List   Diagnosis Date Noted   Genetic testing 11/18/2022   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 11/05/2022   Loss of weight 02/02/2013   Bacteremia 02/02/2013   Pyelonephritis 01/31/2013   IUD (intrauterine device) in place 08/21/2011     REFERRING PROVIDER: Donnie Mesa, MD  REFERRING DIAG: Right Breast [place]  THERAPY DIAG:  Malignant neoplasm of upper-outer quadrant of right female breast, unspecified estrogen receptor status  Abnormal posture  Rationale for Evaluation and Treatment: Rehabilitation  ONSET DATE: 10/18/2022  SUBJECTIVE:                                                                                                                                                                                           SUBJECTIVE STATEMENT:pt declined using video interpreting Surgery went well.  I have a little soreness. I have been doing the exercises. ROM is doing pretty well but have some armpit pain. She doesn't notice any swelling. Pt does not have a treatment plan yet. The compression bra is uncomfortable at the arm pit region PERTINENT HISTORY:  Patient was diagnosed on 10/18/2022 with right breast mass measures 2.2 cm and is located in the upper-outer quadrant. 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 Biopsy was sent to [facility] for a second opinion. Pt had a right lumpectomy with axillary  node biopsy on 11/21/2022 with 0+/1 LN. The resection was poorly differentiated and as yet undetermined.  PATIENT GOALS:  Reassess how my recovery is going related to arm function, pain, and swelling.  PAIN:  Are you having pain? Yes: NPRS scale: 1-4/10 Pain location: Right breast Pain description: sharp, tender Aggravating factors: nothing makes it worse Relieving factors: not sure  PRECAUTIONS: Recent Surgery, right UE Lymphedema risk, Other: Right UE lymphedema risk  ACTIVITY LEVEL / LEISURE: cooking, washing dishes, cleaning the table   OBJECTIVE:   PATIENT SURVEYS:  QUICK DASH: 4.55  OBSERVATIONS: Well healed incision, tender to palpation. Several small scabs present at prox incision    LYMPHEDEMA ASSESSMENT:   POSTURE:Forward head and rounded shoulders posture   UPPER EXTREMITY AROM/PROM:   A/PROM RIGHT   eval   RIGHT 12/12/2022  Shoulder extension 75 60  Shoulder flexion 158 157  Shoulder abduction 180 180  Shoulder internal rotation 60 62  Shoulder external rotation 115 110                          (Blank rows = not tested)   A/PROM LEFT   eval  Shoulder extension 64  Shoulder flexion 165  Shoulder abduction 180  Shoulder internal rotation 60  Shoulder external rotation 112                          (Blank rows = not tested)   CERVICAL AROM: All within functional limits:          UPPER EXTREMITY STRENGTH: WNL    LYMPHEDEMA ASSESSMENTS:    LANDMARK RIGHT   eval RIGHT 12/12/2022   10 cm proximal to olecranon process 29.8 29.6  Olecranon process 24.2 23.3  10 cm proximal to ulnar styloid process 20.7 20.6  Just proximal to ulnar styloid process 15.35 15.2  Across hand at thumb web space 18.5 18.6  At base of 2nd digit 5.7 5.8  (Blank rows = not tested)   LANDMARK LEFT   eval  10 cm proximal to olecranon process 28.4  Olecranon process 23.5  10 cm proximal to ulnar styloid process 20.0  Just proximal to ulnar styloid process 14.7  Across hand at thumb web space 17.7  At base of 2nd digit 5.7  (Blank rows = not tested)    Surgery type/Date: Right Lumpectomy with SLNB 11/21/2022 Number of lymph nodes removed: 0/1 Current/past treatment (chemo, radiation, hormone therapy): further treatment pending testing of tumor Other symptoms:  Heaviness/tightness Yes Pain Yes Pitting edema No Infections No Decreased scar mobility Yes Stemmer sign No  PATIENT EDUCATION:  Education details: Scar massage, continue exercises during radiation if she has to have it, SOZO screens, ABC class Person educated: Patient Education method: Explanation and Handouts Education comprehension: verbalized understanding and returned demonstration  HOME EXERCISE PROGRAM: Reviewed previously given post op HEP. Performed all x 5  ASSESSMENT:  CLINICAL IMPRESSION: Pt is s/p right lumpectomy with SLNB and 0/1 LN on 11/21/2022. Tissue was unremarkable and is still being tested for type of tumor. She has restored ROM to normal and there is no sign of lymphedema. Incision is tender but well healed. Foam pad was made to place in axillary border of compression bra. We reviewed exercises and she was instructed in scar massage. She has resumed most home activities. There are no further needs identified for PT at this time.  Pt will benefit from skilled therapeutic intervention  to improve on the following deficits: Decreased  knowledge of precautions, impaired UE functional use, pain, decreased ROM, postural dysfunction.   PT treatment/interventions: ADL/Self care home management, Therapeutic exercises, Patient/Family education, Self Care, and scar mobilization   GOALS: Goals reviewed with patient? Yes  LONG TERM GOALS:  (STG=LTG)  GOALS Name Target Date  Goal status  1 Pt will demonstrate she has regained full shoulder ROM and function post operatively compared to baselines.  Baseline: 12/12/2022 MET  2     3     4         PLAN:  PT FREQUENCY/DURATION: No further visits set up. Pt is doing well at this time, but was reminded to contact us if she feels she needs to return.  PLAN FOR NEXT SESSION: Pt discharged at this time.   [facility]  [address], [address]  [place] [address]  [phone]  After Breast Cancer Class It is recommended you attend the ABC class to be educated on lymphedema risk reduction. This class is free of charge and lasts for 1 hour. It is a 1-time class. You will need to download the Webex app either on your phone or computer. We will send you a link the night before or the morning of the class. You should be able to click on that link to join the class. This is not a confidential class. You don't have to turn your camera on, but other participants may be able to see your email address.  Scar massage You can begin gentle scar massage to you incision sites. Gently place one hand on the incision and move the skin (without sliding on the skin) in various directions. Do this for a few minutes and then you can gently massage either coconut oil or vitamin E cream into the scars.  Compression garment You should continue wearing your compression bra until you feel like you no longer have swelling.  Home exercise Program Continue doing the exercises you were given until you feel like you can do them without feeling any tightness at the end.   Walking  Program Studies show that 30 minutes of walking per day (fast enough to elevate your heart rate) can significantly reduce the risk of a cancer recurrence. If you can't walk due to other medical reasons, we encourage you to find another activity you could do (like a stationary bike or water exercise).  Posture After breast cancer surgery, people frequently sit with rounded shoulders posture because it puts their incisions on slack and feels better. If you sit like this and scar tissue forms in that position, you can become very tight and have pain sitting or standing with good posture. Try to be aware of your posture and sit and stand up tall to heal properly.  Follow up PT: It is recommended you return every 3 months for the first 3 years following surgery to be assessed on the SOZO machine for an L-Dex score. This helps prevent clinically significant lymphedema in 95% of patients. These follow up screens are 10 minute appointments that you are not billed for.  Claris Pong, PT 12/12/2022, 9:52 AM

## 2022-12-12 ENCOUNTER — Encounter: Payer: Self-pay | Admitting: *Deleted

## 2022-12-12 ENCOUNTER — Ambulatory Visit: Payer: Medicaid Other | Attending: Surgery

## 2022-12-12 DIAGNOSIS — C50411 Malignant neoplasm of upper-outer quadrant of right female breast: Secondary | ICD-10-CM | POA: Diagnosis not present

## 2022-12-12 DIAGNOSIS — R293 Abnormal posture: Secondary | ICD-10-CM

## 2022-12-12 NOTE — Patient Instructions (Addendum)
     [facility]  [address], [address]  [place] [address]  [phone]  After Breast Cancer Class, April 15 12:00 It is recommended you attend the ABC class to be educated on lymphedema risk reduction. This class is free of charge and lasts for 1 hour. It is a 1-time class. You will need to download the Webex app either on your phone or computer. We will send you a link the night before or the morning of the class. You should be able to click on that link to join the class. This is not a confidential class. You don't have to turn your camera on, but other participants may be able to see your email address.  Scar massage You can begin gentle scar massage to you incision sites. Gently place one hand on the incision and move the skin (without sliding on the skin) in various directions. Do this for a few minutes and then you can gently massage either coconut oil or vitamin E cream into the scars.  Compression garment You should continue wearing your compression bra until you feel like you no longer have swelling.  Home exercise Program Continue doing the exercises you were given until you feel like you can do them without feeling any tightness at the end.   Walking Program Studies show that 30 minutes of walking per day (fast enough to elevate your heart rate) can significantly reduce the risk of a cancer recurrence. If you can't walk due to other medical reasons, we encourage you to find another activity you could do (like a stationary bike or water exercise).  Posture After breast cancer surgery, people frequently sit with rounded shoulders posture because it puts their incisions on slack and feels better. If you sit like this and scar tissue forms in that position, you can become very tight and have pain sitting or standing with good posture. Try to be aware of your posture and sit and stand up tall to heal properly.  Follow up PT: June 17, 9:50 AM It is  recommended you return every 3 months for the first 2 years following surgery to be assessed on the SOZO machine for an L-Dex score. This helps prevent clinically significant lymphedema in 95% of patients. These follow up screens are 10 minute appointments that you are not billed for.

## 2022-12-20 ENCOUNTER — Inpatient Hospital Stay: Payer: Medicaid Other | Attending: Hematology and Oncology | Admitting: Hematology and Oncology

## 2022-12-20 ENCOUNTER — Other Ambulatory Visit: Payer: Self-pay

## 2022-12-20 VITALS — BP 135/81 | HR 73 | Temp 97.9°F | Resp 16 | Ht 64.0 in | Wt 147.7 lb

## 2022-12-20 DIAGNOSIS — E079 Disorder of thyroid, unspecified: Secondary | ICD-10-CM | POA: Insufficient documentation

## 2022-12-20 DIAGNOSIS — Z793 Long term (current) use of hormonal contraceptives: Secondary | ICD-10-CM | POA: Diagnosis not present

## 2022-12-20 DIAGNOSIS — C50411 Malignant neoplasm of upper-outer quadrant of right female breast: Secondary | ICD-10-CM

## 2022-12-20 NOTE — Assessment & Plan Note (Signed)
This is a very pleasant 42 year old premenopausal female patient who presented to the breast clinic for right breast poorly differentiated malignancy.  The mass was identified at 11 o'clock position 10 cm from the nipple and has been slowly growing over the past year.  She denies any other concerning symptoms.  On exam, this appears to be a very superficial mass with black pigmentation measuring about 2 cm and no other palpable breast changes or regional adenopathy.  It feels more like a mass arising from the skin rather than the breast tissue.  I have reviewed the pathology which also talks about poorly differentiated malignancy with sheets of cells with marked nuclear atypia and scattered mitotic figures, negative for GATA3, CK7, cytokeratin AE 1, Melan-A.  Additional immunohistochemistry was thought to be performed.  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  She then underwent excision and final specimen was sent to [facility] for comment.  This showed CD34 positive superficial fibroblastic tumor.  In the comment it states open " Despite their somewhat alarming appearance, this neoplasm seem to have extremely low potential for clinically malignant behavior with little to no risk of lymph node or distant metastasis.  Assuming that this mass has been completely resected with negative margins I would recommend simply continued follow-up."  She is here for follow-up after surgery.  Dr. Kenard Gower will read the margins once we get the slides back from [facility].  He was requested to give [place] a call or Dr. Corliss Skains if he finds positive margins.  At this time I will send her to [facility] for second opinion since this is a rare tumor, something that we do not typically encounter and medical oncology here.  She is agreeable to this.  She can continue follow-up with [place] or with Dr. Corliss Skains once a year or as needed.  I would also appreciate recommendations about surveillance frequency.

## 2022-12-20 NOTE — Progress Notes (Signed)
[facility] CONSULT NOTE  Patient Care Team: Patient, No Pcp Per as PCP - General (General Practice) Rachel Moulds, MD as Consulting Physician (Hematology and Oncology)  CHIEF COMPLAINTS/PURPOSE OF CONSULTATION:  Newly diagnosed breast cancer  HISTORY OF PRESENTING ILLNESS:   Karen Berry 42 y.o. female is here because of recent diagnosis of right breast cancer. I reviewed her records extensively and collaborated the history with the patient.  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 Targeted ultrasound performed at the palpable site of concern at 11o'clock 10 cm from the nipple demonstrating a superficial oval circumscribed hypoechoic mass measuring 2.2 x 1.3 x 1.7 cm, with extension into the skin. There is internal vascularity.   Targeted ultrasound of the right axilla demonstrates normal lymph nodes.   10/16/2022 Pathology Results   Pathology from the right breast needle core biopsy shows poorly differentiated malignancy.  The neoplasm is characterized by sheets of cells with moderate to abundant eosinophilic cytoplasm, marked nuclear atypia and scattered mitotic figures. Immunohistochemistry is negative with cytokeratin AE1/AE3, cytokeratin 7, GATA3, Melan-A and CD45RO. Additional immunohistochemistry will be performed and reported as an addendum. The differential based on the morphologic features includes metaplastic carcinoma and high-grade sarcoma.   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 VUS in ALK called c.616G>A identified. The report date is 11/16/2022.  The Multi-Cancer + RNA Panel offered by [facility] includes sequencing and/or deletion/duplication  analysis of the following 70 genes:  AIP*, ALK, APC*, ATM*, AXIN2*, BAP1*, BARD1*, BLM*, BMPR1A*, BRCA1*, BRCA2*, BRIP1*, CDC73*, CDH1*, CDK4, CDKN1B*, CDKN2A, CHEK2*, CTNNA1*, DICER1*, EPCAM, EGFR, FH*, FLCN*, GREM1, HOXB13, KIT, LZTR1, MAX*, MBD4, MEN1*, MET, MITF, MLH1*, MSH2*, MSH3*, MSH6*, MUTYH*, NF1*, NF2*, NTHL1*, PALB2*, PDGFRA, PMS2*, POLD1*, POLE*, POT1*, PRKAR1A*, PTCH1*, PTEN*, RAD51C*, RAD51D*, RB1*, RET, SDHA*, SDHAF2*, SDHB*, SDHC*, SDHD*, SMAD4*, SMARCA4*, SMARCB1*, SMARCE1*, STK11*, SUFU*, TMEM127*, TP53*, TSC1*, TSC2*, VHL*. RNA analysis is performed for * genes.    This is a very pleasant 42 yr old female patient with no significant PMH who first noticed this a tiny lump in the right chest wall closer to the axilla and this has slowly progressed over the past year, has caused some pain and some skin changes which is what alerted her to go to the doctor. She had excision and final path from [facility] showed CD34 positive fibropblastic tumor.  She is doing quite well, excision site has healed really well.  She came with an interpreter today.  MEDICAL HISTORY:  Past Medical History:  Diagnosis Date   Anemia    Thyroid disease     SURGICAL HISTORY: Past Surgical History:  Procedure Laterality Date   BREAST BIOPSY Right 10/16/2022   [place] RT BREAST BX W LOC DEV 1ST LESION IMG BX SPEC US GUIDE 10/16/2022 [facility] MAMMOGRAPHY   BREAST LUMPECTOMY WITH AXILLARY LYMPH NODE BIOPSY Right 11/21/2022   Procedure: RIGHT BREAST LUMPECTOMY WITH AXILLARY LYMPH NODE BIOPSY;  Surgeon: Manus Rudd, MD;  Location: [facility];  Service: General;  Laterality: Right;   female circumcision     NO PAST SURGERIES      SOCIAL HISTORY: Social History   Socioeconomic History   Marital status: Married    Spouse name: Not on  file   Number of children: 6   Years of education: Not on file   Highest education level: Not on file  Occupational History   Not on file  Tobacco Use   Smoking status:  Never   Smokeless tobacco: Never  Vaping Use   Vaping Use: Never used  Substance and Sexual Activity   Alcohol use: No   Drug use: No   Sexual activity: Yes    Birth control/protection: I.U.D.  Other Topics Concern   Not on file  Social History Narrative   Not on file   Social Determinants of Health   Financial Resource Strain: Not on file  Food Insecurity: No Food Insecurity (10/10/2022)   Hunger Vital Sign    Worried About Running Out of Food in the Last Year: Never true    Ran Out of Food in the Last Year: Never true  Transportation Needs: No Transportation Needs (10/10/2022)   PRAPARE - Administrator, Civil Service (Medical): No    Lack of Transportation (Non-Medical): No  Physical Activity: Not on file  Stress: Not on file  Social Connections: Not on file  Intimate Partner Violence: Not on file    FAMILY HISTORY: No family history on file.  ALLERGIES:  is allergic to beef-derived products and pork-derived products.  MEDICATIONS:  Current Outpatient Medications  Medication Sig Dispense Refill   levonorgestrel (MIRENA) 20 MCG/DAY IUD 1 each by Intrauterine route once. Placed 08/21/2011     oxyCODONE (OXY IR/ROXICODONE) 5 MG immediate release tablet Take 1 tablet (5 mg total) by mouth every 6 (six) hours as needed for severe pain. 15 tablet 0   No current facility-administered medications for this visit.    REVIEW OF SYSTEMS:   Constitutional: Denies fevers, chills or abnormal night sweats Eyes: Denies blurriness of vision, double vision or watery eyes Ears, nose, mouth, throat, and face: Denies mucositis or sore throat Respiratory: Denies cough, dyspnea or wheezes Cardiovascular: Denies palpitation, chest discomfort or lower extremity swelling Gastrointestinal:  Denies nausea, heartburn or change in bowel habits Skin: Denies abnormal skin rashes Lymphatics: Denies new lymphadenopathy or easy bruising Neurological:Denies numbness, tingling or new  weaknesses Behavioral/Psych: Mood is stable, no new changes  Breast: Denies any palpable lumps or discharge All other systems were reviewed with the patient and are negative.  PHYSICAL EXAMINATION: ECOG PERFORMANCE STATUS: 0 - Asymptomatic  Vitals:   12/20/22 1246  BP: 135/81  Pulse: 73  Resp: 16  Temp: 97.9 F (36.6 C)  SpO2: 100%   Filed Weights   12/20/22 1246  Weight: 147 lb 11.2 oz (67 kg)    GENERAL:alert, no distress and comfortable Excision site appears to have healed well.  LABORATORY DATA:  I have reviewed the data as listed Lab Results  Component Value Date   WBC 7.4 11/06/2022   HGB 14.8 11/06/2022   HCT 43.3 11/06/2022   MCV 86.1 11/06/2022   PLT 310 11/06/2022   Lab Results  Component Value Date   NA 137 11/06/2022   K 4.0 11/06/2022   CL 104 11/06/2022   CO2 28 11/06/2022    RADIOGRAPHIC STUDIES: I have personally reviewed the radiological reports and agreed with the findings in the report.  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  The mass was identified at 11 o'clock position 10 cm  from the nipple and has been slowly growing over the past year.  She denies any other concerning symptoms.  On exam, this appears to be a very superficial mass with black pigmentation measuring about 2 cm and no other palpable breast changes or regional adenopathy.  It feels more like a mass arising from the skin rather than the breast tissue.  I have reviewed the pathology which also talks about poorly differentiated malignancy with sheets of cells with marked nuclear atypia and scattered mitotic figures, negative for GATA3, CK7, cytokeratin AE 1, Melan-A.  Additional immunohistochemistry was thought to be performed.  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  She then underwent excision and final specimen was sent to [facility] for comment.  This showed CD34 positive superficial fibroblastic tumor.  In the comment it states open " Despite their somewhat alarming appearance, this neoplasm seem to have extremely low potential for clinically malignant behavior with little to no risk of lymph node or distant metastasis.  Assuming that this mass has been completely resected with negative margins I would recommend simply continued follow-up."  She is here for follow-up after surgery.  Dr. Kenard Gower will read the margins once we get the slides back from [facility].  He was requested to give [place] a call or Dr. Corliss Skains if he finds positive margins.  At this time I will send her to [facility] for second opinion since this is a rare tumor, something that we do not typically encounter and medical oncology here.  She is agreeable to this.  She can continue follow-up with [place] or with Dr. Corliss Skains once a year or as needed.  I would also appreciate recommendations about surveillance frequency.   Total time spent: 30 minutes including history, review of imaging pathology report, physical exam, counseling, coordination of care. All questions were answered. The patient knows to call the clinic with any problems, questions or concerns.    Rachel Moulds, MD 12/20/22

## 2023-01-09 LAB — SURGICAL PATHOLOGY

## 2023-01-13 ENCOUNTER — Other Ambulatory Visit: Payer: Self-pay | Admitting: *Deleted

## 2023-01-13 DIAGNOSIS — C50411 Malignant neoplasm of upper-outer quadrant of right female breast: Secondary | ICD-10-CM

## 2023-01-13 DIAGNOSIS — C493 Malignant neoplasm of connective and soft tissue of thorax: Secondary | ICD-10-CM

## 2023-01-16 ENCOUNTER — Ambulatory Visit: Payer: Self-pay | Admitting: Surgery

## 2023-01-16 NOTE — H&P (Signed)
Karen Berry is an 42 y.o. female.   Chief Complaint: Right breast mass HPI: This is a 42 year old female from [place] who presents with a small skin lesion in the right upper outer quadrant of her breast for at least a year.  Initially this area was nonpigmented.  Over the last 4 months it has grown in size and has become dark purple.  It has also become painful.  About 4 months ago she also noticed a palpable mass underneath the skin lesion.   She sought medical attention for this area and was first seen on 10/10/2022 by the nurse practitioner for the [facility] program.  Imaging was done urgently that revealed a 2.2 x 1.3 x 1.7 cm hypoechoic mass located at 11:00 in the right breast 10 cm from the nipple.  This extends into the skin.  There is internal vascularity.  Axillary ultrasound showed normal lymph nodes.  She underwent biopsy on 10/16/2022.  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     The patient has no family history of breast cancer.  She has 6 children.  The youngest is 13.   MRI showed only a 2.4 x 2.3 cm mass in the upper outer right breast with overlying skin involvement.  There is an adjacent intramammary lymph node.  No other lymphadenopathy noted.   On 11/21/2022, she underwent wide excision of this right chest/breast mass.  Again, the pathology was quite difficult.  The specimen was sent to [facility], [facility], and [facility] for second opinions.  All of these returned a diagnosis of "Superficial CD34 positive fibroblastic tumor" which is considered benign.  Unfortunately, margins were not mentioned in the outside reports.  The specimen slides have returned to [place].  Our pathologists are now reading that the margin is positive at the junction of the anterior and medial margins.    Past Medical History:  Diagnosis Date   Anemia    Thyroid disease     Past Surgical History:  Procedure  Laterality Date   BREAST BIOPSY Right 10/16/2022   [place] RT BREAST BX W LOC DEV 1ST LESION IMG BX SPEC US GUIDE 10/16/2022 [facility]   BREAST LUMPECTOMY WITH AXILLARY LYMPH NODE BIOPSY Right 11/21/2022   Procedure: RIGHT BREAST LUMPECTOMY WITH AXILLARY LYMPH NODE BIOPSY;  Surgeon: Manus Rudd, MD;  Location: [facility];  Service: General;  Laterality: Right;   female circumcision     NO PAST SURGERIES      No family history on file. Social History:  reports that she has never smoked. She has never used smokeless tobacco. She reports that she does not drink alcohol and does not use drugs.  Allergies:  Allergies  Allergen Reactions   Beef-Derived Products     Religious reasons   Pork-Derived Products     Religious reasons       Physical Exam  The right chest/ breast incision is healing well with no sign of recurrent mass or infection.  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  Recommend reexcision of the anterior-medial margin.  The surgical procedure has been discussed with the patient.  Potential risks, benefits, alternative treatments, and expected outcomes have been explained.  All of the patient's questions at this time have been answered.  The likelihood of reaching the patient's treatment goal is good.  The patient understand the proposed surgical procedure and wishes to proceed.   Wilmon Arms  Tylek Boney, MD 01/16/2023, 5:19 PM

## 2023-02-04 ENCOUNTER — Ambulatory Visit: Payer: Self-pay | Admitting: Surgery

## 2023-02-04 NOTE — H&P (Signed)
Subjective   Chief Complaint: RE-CHECK     History of Present Illness: Karen Berry is a 42 y.o. female who is seen today as an office consultation at the request of Dr. Corliss Skains for evaluation of RE-CHECK .   This is a 42 year old female from [place] who presents with a small skin lesion in the right upper outer quadrant of her breast for at least a year.  Initially this area was nonpigmented.  Over the last 4 months it has grown in size and has become dark purple.  It has also become painful.  About 4 months ago she also noticed a palpable mass underneath the skin lesion.   She sought medical attention for this area and was first seen on 10/10/2022 by the nurse practitioner for the [facility] program.  Imaging was done urgently that revealed a 2.2 x 1.3 x 1.7 cm hypoechoic mass located at 11:00 in the right breast 10 cm from the nipple.  This extends into the skin.  There is internal vascularity.  Axillary ultrasound showed normal lymph nodes.  She underwent biopsy on 10/16/2022.  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  The report stated that additional immunohistochemistry would be performed and reported as an addendum.  At this point, no addendum has been seen in the patient's record.   The patient has no family history of breast cancer.  She has 6 children.  The youngest is 67.   MRI showed only a 2.4 x 2.3 cm mass in the upper outer right breast with overlying skin involvement.  There is an adjacent intramammary lymph node.  No other lymphadenopathy noted.   Although we did not have a definitive preoperative diagnosis, we proceeded with right breast lumpectomy and sentinel lymph node biopsy on 11/21/2022.  The sentinel lymph node was actually an intramammary node directly next to the mass.  We did a wide excision with grossly clear margins.  Again, our local pathologist had difficulty with the  diagnosis.  Her slides were sent to [facility] clinic, [facility], and [facility].  All of these came to the same conclusion of a "Superficial CD34 positive fibroblastic tumor".  This is not felt to be malignant.  The patient is doing quite well.  She still has small amount of residual soreness around her incision but otherwise is doing well.  It took several weeks for the tissue specimens to return to [place].  I asked our pathologist to assess the margins of the lumpectomy.  They returned a late diagnosis of a focally positive margin at the junction of the anterior and medial margins.  We have had some difficulty in contacting the patient but they were finally able to reach them and ask her to come in for a discussion.  She is accompanied by her son who translates.  The patient has returned to work at [employer].  She states that she still has some soreness around her incision.  Her job does require some heavy lifting.  Medical History: Past Medical History:  Diagnosis Date   Anemia    Thyroid disease     Patient Active Problem List  Diagnosis   Sarcoma of chest wall (CMS/HHS-HCC)    History reviewed. No pertinent surgical history.   No Known Allergies  No current outpatient medications on file prior to visit.   No current facility-administered medications on file prior to visit.    History reviewed. No pertinent family  history.   Social History   Tobacco Use  Smoking Status Never  Smokeless Tobacco Never     Social History   Socioeconomic History   Marital status: Married  Tobacco Use   Smoking status: Never   Smokeless tobacco: Never  Vaping Use   Vaping status: Never Used  Substance and Sexual Activity   Alcohol use: Never   Drug use: Never   Social Determinants of Health   Food Insecurity: No Food Insecurity (10/10/2022)   Received from [facility] [place]   Hunger Vital Sign    Worried About Running Out of Food in the Last Year: Never true    Ran Out  of Food in the Last Year: Never true  Transportation Needs: No Transportation Needs (10/10/2022)   Received from [facility], [place]   PRAPARE - Transportation    Lack of Transportation (Medical): No    Lack of Transportation (Non-Medical): No    Objective:    Vitals:   02/04/23 1549  PainSc: 0-No pain    Physical Exam   Well-developed well-nourished no apparent distress Her right chest wall incision seems to be healing well.  The scar is fairly thick.  There is firm palpable scar tissue underneath the incision but there is no distinct mass.  No sign of infection.    Assessment and Plan:  Diagnoses and all orders for this visit:  Soft tissue tumor    Superficial CD34 positive fibroblastic tumor It does not appear that this rare tumor represents a malignancy.  However, the anterior medial margin is focally positive.  Since she will not be undergoing any further treatment for this, we do recommend reexcision of this anterior medial margin to allow clear margins.  I explained this to the patient and and her son.  She is reluctant to undergo further surgery but she understands that this will be a fairly minor procedure.  She would like to stay off work for a few weeks to see if the soreness improves.  We gave her a note for work.  Reexcision of anterior and medial margins of the right chest wall mass.  The surgical procedure has been discussed with the patient.  Potential risks, benefits, alternative treatments, and expected outcomes have been explained.  All of the patient's questions at this time have been answered.  The likelihood of reaching the patient's treatment goal is good.  The patient understand the proposed surgical procedure and wishes to proceed.     [name], MD  02/04/2023

## 2023-02-11 ENCOUNTER — Telehealth: Payer: Self-pay

## 2023-02-11 NOTE — Telephone Encounter (Signed)
Patient approved [facility] Medicaid 02/01/204-12/08/2022, MXP (Medicaid Expansion): 12/09/2022-03/09/2023, BCCM: 03/10/2023-01/07/2024, MID: [id number] M. Patient informed.

## 2023-02-24 ENCOUNTER — Ambulatory Visit: Payer: Medicaid Other | Attending: Surgery

## 2023-02-24 DIAGNOSIS — R293 Abnormal posture: Secondary | ICD-10-CM | POA: Insufficient documentation

## 2023-02-24 DIAGNOSIS — C50411 Malignant neoplasm of upper-outer quadrant of right female breast: Secondary | ICD-10-CM | POA: Insufficient documentation

## 2023-12-23 ENCOUNTER — Inpatient Hospital Stay: Payer: Medicaid Other | Attending: Hematology and Oncology | Admitting: Hematology and Oncology

## 2023-12-23 VITALS — BP 102/64 | HR 84 | Temp 98.1°F | Resp 17 | Wt 156.2 lb

## 2023-12-23 DIAGNOSIS — C50411 Malignant neoplasm of upper-outer quadrant of right female breast: Secondary | ICD-10-CM | POA: Insufficient documentation

## 2023-12-23 DIAGNOSIS — Z793 Long term (current) use of hormonal contraceptives: Secondary | ICD-10-CM | POA: Insufficient documentation

## 2023-12-23 DIAGNOSIS — N644 Mastodynia: Secondary | ICD-10-CM | POA: Diagnosis not present

## 2023-12-23 NOTE — Assessment & Plan Note (Signed)
 This is a very pleasant 43 year old premenopausal female patient who presented to the breast clinic for right breast poorly differentiated malignancy.  The mass was identified at 11 o'clock position 10 cm from the nipple and has been slowly growing over the past year.  She denies any other concerning symptoms.  On exam, this appears to be a very superficial mass with black pigmentation measuring about 2 cm and no other palpable breast changes or regional adenopathy.  It feels more like a mass arising from the skin rather than the breast tissue.  I have reviewed the pathology which also talks about poorly differentiated malignancy with sheets of cells with marked nuclear atypia and scattered mitotic figures, negative for GATA3, CK7, cytokeratin AE 1, Melan-A.  Additional immunohistochemistry was thought to be performed.  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  She then underwent excision and final specimen was sent to [facility] for comment.  This showed CD34 positive superficial fibroblastic tumor.  In the comment it states open " Despite their somewhat alarming appearance, this neoplasm seem to have extremely low potential for clinically malignant behavior with little to no risk of lymph node or distant metastasis.  Assuming that this mass has been completely resected with negative margins I would recommend simply continued follow-up."  She was found to have pos margin on further analysis but she refused any additional surgery after visit with Dr Eli Grizzle. She is now here for a follow up because of mastalgia bilaterally.  Assessment & Plan Breast tumor with positive margins CD34 pos fibroblastic tumor with positive margins post-surgery.  She didn't want to proceed with margin clearance as recommended. NO concerns on breast exam today. No regional adenopathy - Order mammogram. Gave her the phone number to call and schedule. - Message breast surgeon for follow-up. -  Schedule follow-up appointment with breast surgeon as recommended.  Mastalgia Mild intermittent mastalgia likely related to hormonal changes and caffeine intake. - Monitor breast pain in relation to menstrual cycle. - Advise reduction of caffeine intake.  FU with me in 6 months or sooner as needed.

## 2023-12-23 NOTE — Progress Notes (Signed)
 [facility] CONSULT NOTE  Patient Care Team: Patient, No Pcp Per as PCP - General (General Practice) Rachel Moulds, MD as Consulting Physician (Hematology and Oncology)  CHIEF COMPLAINTS/PURPOSE OF CONSULTATION:  Newly diagnosed breast cancer  HISTORY OF PRESENTING ILLNESS:   Meredyth Denz 43 y.o. female is here because of recent diagnosis of right breast cancer. I reviewed her records extensively and collaborated the history with the patient.  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 Targeted ultrasound performed at the palpable site of concern at 11o'clock 10 cm from the nipple demonstrating a superficial oval circumscribed hypoechoic mass measuring 2.2 x 1.3 x 1.7 cm, with extension into the skin. There is internal vascularity.   Targeted ultrasound of the right axilla demonstrates normal lymph nodes.   10/16/2022 Pathology Results   Pathology from the right breast needle core biopsy shows poorly differentiated malignancy.  The neoplasm is characterized by sheets of cells with moderate to abundant eosinophilic cytoplasm, marked nuclear atypia and scattered mitotic figures. Immunohistochemistry is negative with cytokeratin AE1/AE3, cytokeratin 7, GATA3, Melan-A and CD45RO. Additional immunohistochemistry will be performed and reported as an addendum. The differential based on the morphologic features includes metaplastic carcinoma and high-grade sarcoma.   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 VUS in ALK called c.616G>A identified. The report date is 11/16/2022.  The Multi-Cancer + RNA Panel offered by [facility] includes sequencing and/or  deletion/duplication analysis of the following 70 genes:  AIP*, ALK, APC*, ATM*, AXIN2*, BAP1*, BARD1*, BLM*, BMPR1A*, BRCA1*, BRCA2*, BRIP1*, CDC73*, CDH1*, CDK4, CDKN1B*, CDKN2A, CHEK2*, CTNNA1*, DICER1*, EPCAM, EGFR, FH*, FLCN*, GREM1, HOXB13, KIT, LZTR1, MAX*, MBD4, MEN1*, MET, MITF, MLH1*, MSH2*, MSH3*, MSH6*, MUTYH*, NF1*, NF2*, NTHL1*, PALB2*, PDGFRA, PMS2*, POLD1*, POLE*, POT1*, PRKAR1A*, PTCH1*, PTEN*, RAD51C*, RAD51D*, RB1*, RET, SDHA*, SDHAF2*, SDHB*, SDHC*, SDHD*, SMAD4*, SMARCA4*, SMARCB1*, SMARCE1*, STK11*, SUFU*, TMEM127*, TP53*, TSC1*, TSC2*, VHL*. RNA analysis is performed for * genes.     She had excision and final path from [facility] showed CD34 positive fibropblastic tumor.  Dr Corliss Skains recommended re excising positive margins but she didn't follow up on this. We recommended second opinion at [facility] and she didn't go to this appt.  Discussed the use of AI scribe software for clinical note transcription with the patient, who gave verbal consent to proceed.  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  She has a history of a rare breast tumor, initially suspected to be a severe form of cancer but later identified as a milder version. During her last consultation with her breast surgeon, a recommendation was made for a minor surgery to clean the margins, which she opted not to pursue at that time. She has not seen any other doctors since her last visit with the surgeon.  She experiences mild, intermittent pain in both breasts, described as an 'achy pain all over.'It is not new and is not related to the surgery. She notes that the pain might be associated with her menstrual cycle, as it occurs more frequently before menstruation.  She has not had a mammogram since her last  visit and acknowledges being overdue for this  screening.   MEDICAL HISTORY:  Past Medical History:  Diagnosis Date   Anemia    Thyroid disease     SURGICAL HISTORY: Past Surgical History:  Procedure Laterality Date   BREAST BIOPSY Right 10/16/2022   [place] RT BREAST BX W LOC DEV 1ST LESION IMG BX SPEC US GUIDE 10/16/2022 [facility]MOGRAP[facility]   BREAST LUMPECTOMY WITH AXILLARY LYMPH NODE BIOPSY Right 11/21/2022   Procedure: RIGHT BREAST LUMPECTOMY WITH AXILLARY LYMPH NODE BIOPSY;  Surgeon: Manus Rudd, MD;  Location: [facility];  Service: General;  Laterality: Right;   female circumcision     NO PAST SURGERIES      SOCIAL HISTORY: Social History   Socioeconomic History   Marital status: Married    Spouse name: Not on file   Number of children: 6   Years of education: Not on file   Highest education level: Not on file  Occupational History   Not on file  Tobacco Use   Smoking status: Never   Smokeless tobacco: Never  Vaping Use   Vaping status: Never Used  Substance and Sexual Activity   Alcohol use: No   Drug use: No   Sexual activity: Yes    Birth control/protection: I.U.D.  Other Topics Concern   Not on file  Social History Narrative   Not on file   Social Drivers of Health   Financial Resource Strain: Not on file  Food Insecurity: No Food Insecurity (10/10/2022)   Hunger Vital Sign    Worried About Running Out of Food in the Last Year: Never true    Ran Out of Food in the Last Year: Never true  Transportation Needs: No Transportation Needs (10/10/2022)   PRAPARE - Administrator, Civil Service (Medical): No    Lack of Transportation (Non-Medical): No  Physical Activity: Not on file  Stress: Not on file  Social Connections: Not on file  Intimate Partner Violence: Not on file    FAMILY HISTORY: No family history on file.  ALLERGIES:  is allergic to beef-derived drug products and pork-derived products.  MEDICATIONS:  Current Outpatient Medications  Medication Sig Dispense Refill    levonorgestrel (MIRENA) 20 MCG/DAY IUD 1 each by Intrauterine route once. Placed 08/21/2011     oxyCODONE (OXY IR/ROXICODONE) 5 MG immediate release tablet Take 1 tablet (5 mg total) by mouth every 6 (six) hours as needed for severe pain. 15 tablet 0   No current facility-administered medications for this visit.    REVIEW OF SYSTEMS:   Constitutional: Denies fevers, chills or abnormal night sweats Eyes: Denies blurriness of vision, double vision or watery eyes Ears, nose, mouth, throat, and face: Denies mucositis or sore throat Respiratory: Denies cough, dyspnea or wheezes Cardiovascular: Denies palpitation, chest discomfort or lower extremity swelling Gastrointestinal:  Denies nausea, heartburn or change in bowel habits Skin: Denies abnormal skin rashes Lymphatics: Denies new lymphadenopathy or easy bruising Neurological:Denies numbness, tingling or new weaknesses Behavioral/Psych: Mood is stable, no new changes  Breast: Denies any palpable lumps or discharge All other systems were reviewed with the patient and are negative.  PHYSICAL EXAMINATION: ECOG PERFORMANCE STATUS: 0 - Asymptomatic  Vitals:   12/23/23 1202  BP: 102/64  Pulse: 84  Resp: 17  Temp: 98.1 F (36.7 C)  SpO2: 100%   Filed Weights   12/23/23 1202  Weight: 156 lb 3.2 oz (70.9 kg)    GENERAL:alert, no distress and comfortable Bilateral breasts inspected  and palpated. No palpable masses. No regional adenopathy No LE edema.  LABORATORY DATA:  I have reviewed the data as listed Lab Results  Component Value Date   WBC 7.4 11/06/2022   HGB 14.8 11/06/2022   HCT 43.3 11/06/2022   MCV 86.1 11/06/2022   PLT 310 11/06/2022   Lab Results  Component Value Date   NA 137 11/06/2022   K 4.0 11/06/2022   CL 104 11/06/2022   CO2 28 11/06/2022    RADIOGRAPHIC STUDIES: I have personally reviewed the radiological reports and agreed with the findings in the report.  ASSESSMENT AND PLAN:  Primary cancer of  upper outer quadrant of right breast [facility]) This is a very pleasant 43 year old premenopausal female patient who presented to the breast clinic for right breast poorly differentiated malignancy.  The mass was identified at 11 o'clock position 10 cm from the nipple and has been slowly growing over the past year.  She denies any other concerning symptoms.  On exam, this appears to be a very superficial mass with black pigmentation measuring about 2 cm and no other palpable breast changes or regional adenopathy.  It feels more like a mass arising from the skin rather than the breast tissue.  I have reviewed the pathology which also talks about poorly differentiated malignancy with sheets of cells with marked nuclear atypia and scattered mitotic figures, negative for GATA3, CK7, cytokeratin AE 1, Melan-A.  Additional immunohistochemistry was thought to be performed.  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  She then underwent excision and final specimen was sent to [facility] for comment.  This showed CD34 positive superficial fibroblastic tumor.  In the comment it states open " Despite their somewhat alarming appearance, this neoplasm seem to have extremely low potential for clinically malignant behavior with little to no risk of lymph node or distant metastasis.  Assuming that this mass has been completely resected with negative margins I would recommend simply continued follow-up."  She was found to have pos margin on further analysis but she refused any additional surgery after visit with Dr Eli Grizzle. She is now here for a follow up because of mastalgia bilaterally.  Assessment & Plan Breast tumor with positive margins CD34 pos fibroblastic tumor with positive margins post-surgery.  She didn't want to proceed with margin clearance as recommended. NO concerns on breast exam today. No regional adenopathy - Order mammogram. Gave her the phone number to call and schedule. -  Message breast surgeon for follow-up. - Schedule follow-up appointment with breast surgeon as recommended.  Mastalgia Mild intermittent mastalgia likely related to hormonal changes and caffeine intake. - Monitor breast pain in relation to menstrual cycle. - Advise reduction of caffeine intake.  FU with me in 6 months or sooner as needed.    Total time spent: 30 minutes including history, review of imaging pathology report, physical exam, counseling, coordination of care. All questions were answered. The patient knows to call the clinic with any problems, questions or concerns.    Murleen Arms, MD 12/23/23

## 2024-04-04 ENCOUNTER — Emergency Department (HOSPITAL_COMMUNITY)
Admission: EM | Admit: 2024-04-04 | Discharge: 2024-04-04 | Disposition: A | Discharge status: Home / Self Care | Attending: Emergency Medicine | Admitting: Emergency Medicine

## 2024-04-04 ENCOUNTER — Other Ambulatory Visit: Payer: Self-pay

## 2024-04-04 DIAGNOSIS — N390 Urinary tract infection, site not specified: Secondary | ICD-10-CM | POA: Diagnosis not present

## 2024-04-04 DIAGNOSIS — B349 Viral infection, unspecified: Secondary | ICD-10-CM | POA: Insufficient documentation

## 2024-04-04 DIAGNOSIS — R509 Fever, unspecified: Secondary | ICD-10-CM | POA: Diagnosis present

## 2024-04-04 DIAGNOSIS — H9209 Otalgia, unspecified ear: Secondary | ICD-10-CM | POA: Diagnosis not present

## 2024-04-04 LAB — URINALYSIS, ROUTINE W REFLEX MICROSCOPIC
Bilirubin Urine: NEGATIVE
Glucose, UA: NEGATIVE mg/dL
Hgb urine dipstick: NEGATIVE
Ketones, ur: NEGATIVE mg/dL
Nitrite: POSITIVE — AB
Protein, ur: NEGATIVE mg/dL
Specific Gravity, Urine: 1.014 (ref 1.005–1.030)
pH: 7 (ref 5.0–8.0)

## 2024-04-04 LAB — CBC WITH DIFFERENTIAL/PLATELET
Abs Immature Granulocytes: 0.03 K/uL (ref 0.00–0.07)
Basophils Absolute: 0.1 K/uL (ref 0.0–0.1)
Basophils Relative: 1 %
Eosinophils Absolute: 0.1 K/uL (ref 0.0–0.5)
Eosinophils Relative: 1 %
HCT: 43.9 % (ref 36.0–46.0)
Hemoglobin: 14.4 g/dL (ref 12.0–15.0)
Immature Granulocytes: 0 %
Lymphocytes Relative: 26 %
Lymphs Abs: 2.2 K/uL (ref 0.7–4.0)
MCH: 28.6 pg (ref 26.0–34.0)
MCHC: 32.8 g/dL (ref 30.0–36.0)
MCV: 87.3 fL (ref 80.0–100.0)
Monocytes Absolute: 0.5 K/uL (ref 0.1–1.0)
Monocytes Relative: 6 %
Neutro Abs: 5.6 K/uL (ref 1.7–7.7)
Neutrophils Relative %: 66 %
Platelets: 328 K/uL (ref 150–400)
RBC: 5.03 MIL/uL (ref 3.87–5.11)
RDW: 12.5 % (ref 11.5–15.5)
WBC: 8.5 K/uL (ref 4.0–10.5)
nRBC: 0 % (ref 0.0–0.2)

## 2024-04-04 LAB — COMPREHENSIVE METABOLIC PANEL WITH GFR
ALT: 46 U/L — ABNORMAL HIGH (ref 0–44)
AST: 36 U/L (ref 15–41)
Albumin: 3.4 g/dL — ABNORMAL LOW (ref 3.5–5.0)
Alkaline Phosphatase: 68 U/L (ref 38–126)
Anion gap: 8 (ref 5–15)
BUN: 8 mg/dL (ref 6–20)
CO2: 26 mmol/L (ref 22–32)
Calcium: 9.4 mg/dL (ref 8.9–10.3)
Chloride: 104 mmol/L (ref 98–111)
Creatinine, Ser: 0.57 mg/dL (ref 0.44–1.00)
GFR, Estimated: >60 mL/min (ref >60)
Glucose, Bld: 109 mg/dL — ABNORMAL HIGH (ref 70–99)
Potassium: 3.7 mmol/L (ref 3.5–5.1)
Sodium: 138 mmol/L (ref 135–145)
Total Bilirubin: 0.4 mg/dL (ref 0.0–1.2)
Total Protein: 7.9 g/dL (ref 6.5–8.1)

## 2024-04-04 LAB — RESP PANEL BY RT-PCR (RSV, FLU A&B, COVID)  RVPGX2
Influenza A by PCR: NEGATIVE
Influenza B by PCR: NEGATIVE
Resp Syncytial Virus by PCR: NEGATIVE
SARS Coronavirus 2 by RT PCR: POSITIVE — AB

## 2024-04-04 MED ORDER — ONDANSETRON 4 MG PO TBDP
4.0000 mg | ORAL_TABLET | Freq: Once | ORAL | Status: AC
  Administered 2024-04-04: 4 mg via ORAL
  Filled 2024-04-04: qty 1

## 2024-04-04 MED ORDER — METOCLOPRAMIDE HCL 5 MG/ML IJ SOLN
10.0000 mg | Freq: Once | INTRAMUSCULAR | Status: AC
  Administered 2024-04-04: 10 mg via INTRAMUSCULAR

## 2024-04-04 MED ORDER — IBUPROFEN 200 MG PO TABS
600.0000 mg | ORAL_TABLET | Freq: Once | ORAL | Status: AC
  Administered 2024-04-04: 600 mg via ORAL
  Filled 2024-04-04: qty 3

## 2024-04-04 MED ORDER — METOCLOPRAMIDE HCL 5 MG/ML IJ SOLN
10.0000 mg | Freq: Once | INTRAMUSCULAR | Status: DC
  Filled 2024-04-04: qty 2

## 2024-04-04 MED ORDER — CEPHALEXIN 500 MG PO CAPS
500.0000 mg | ORAL_CAPSULE | Freq: Once | ORAL | Status: AC
  Administered 2024-04-04: 500 mg via ORAL
  Filled 2024-04-04: qty 1

## 2024-04-04 MED ORDER — ONDANSETRON 4 MG PO TBDP: 4.0000 mg | ORAL_TABLET | Freq: Three times a day (TID) | ORAL | 0 refills | Status: AC | PRN

## 2024-04-04 MED ORDER — DEXAMETHASONE SODIUM PHOSPHATE 10 MG/ML IJ SOLN
10.0000 mg | Freq: Once | INTRAMUSCULAR | Status: AC
  Administered 2024-04-04: 10 mg via INTRAMUSCULAR
  Filled 2024-04-04: qty 1

## 2024-04-04 MED ORDER — CEPHALEXIN 500 MG PO CAPS: 500.0000 mg | ORAL_CAPSULE | Freq: Two times a day (BID) | ORAL | 0 refills | Status: AC

## 2024-04-04 NOTE — ED Provider Notes (Signed)
 [place] EMERGENCY DEPARTMENT AT [facility] Provider Note   CSN: [id number] Arrival date & time: 04/04/24  1337     Patient presents with: Fever, Headache, and Otalgia   Karen Berry is a 43 y.o. female here with 5 days of congestion, ear pain, headache, sore throat, coughing, poor appetite and diarrhea.  Her son is present at bedside.  Sick family member last week in house with viral illness. No foreign travel. No other medical issues.   HPI     Prior to Admission medications   Medication Sig Start Date End Date Taking? Authorizing Provider  cephALEXin  (KEFLEX ) 500 MG capsule Take 1 capsule (500 mg total) by mouth 2 (two) times daily for 7 days. 04/05/24 04/12/24 Yes Brandee Markin, Donnice PARAS, MD  ondansetron  (ZOFRAN -ODT) 4 MG disintegrating tablet Take 1 tablet (4 mg total) by mouth every 8 (eight) hours as needed for up to 15 doses for nausea or vomiting. 04/04/24  Yes Edwin Baines, Donnice PARAS, MD  levonorgestrel  (MIRENA ) 20 MCG/DAY IUD 1 each by Intrauterine route once. Placed 08/21/2011    [provider]  oxyCODONE  (OXY IR/ROXICODONE ) 5 MG immediate release tablet Take 1 tablet (5 mg total) by mouth every 6 (six) hours as needed for severe pain. 11/21/22   Belinda Donnice, MD    Allergies: Beef-derived drug products and Pork-derived products    Review of Systems  Updated Vital Signs BP 124/83 (BP Location: Left Arm)   Pulse 89   Temp 99 F (37.2 C) (Oral)   Resp 16   Ht 5' 4 (1.626 m)   Wt 68 kg   SpO2 100%   BMI 25.75 kg/m   Physical Exam Constitutional:      General: She is not in acute distress. HENT:     Head: Normocephalic and atraumatic.  Eyes:     Conjunctiva/sclera: Conjunctivae normal.     Pupils: Pupils are equal, round, and reactive to light.  Cardiovascular:     Rate and Rhythm: Normal rate and regular rhythm.  Pulmonary:     Effort: Pulmonary effort is normal. No respiratory distress.  Abdominal:     General: There is no distension.      Tenderness: There is no abdominal tenderness.  Skin:    General: Skin is warm and dry.  Neurological:     General: No focal deficit present.     Mental Status: She is alert. Mental status is at baseline.  Psychiatric:        Mood and Affect: Mood normal.        Behavior: Behavior normal.     (all labs ordered are listed, but only abnormal results are displayed) Labs Reviewed  COMPREHENSIVE METABOLIC PANEL WITH GFR - Abnormal; Notable for the following components:      Result Value   Glucose, Bld 109 (*)    Albumin 3.4 (*)    ALT 46 (*)    All other components within normal limits  URINALYSIS, ROUTINE W REFLEX MICROSCOPIC - Abnormal; Notable for the following components:   Nitrite POSITIVE (*)    Leukocytes,Ua SMALL (*)    Bacteria, UA RARE (*)    All other components within normal limits  RESP PANEL BY RT-PCR (RSV, FLU A&B, COVID)  RVPGX2  URINE CULTURE  CBC WITH DIFFERENTIAL/PLATELET    EKG: None  Radiology: No results found.   Procedures   Medications Ordered in the ED  ondansetron  (ZOFRAN -ODT) disintegrating tablet 4 mg (has no administration in time range)  dexamethasone  (DECADRON )  injection 10 mg (has no administration in time range)  metoCLOPramide  (REGLAN ) injection 10 mg (has no administration in time range)  ibuprofen  (ADVIL ) tablet 600 mg (has no administration in time range)  cephALEXin  (KEFLEX ) capsule 500 mg (has no administration in time range)                                    Medical Decision Making  Patient here with 5 days illness Suspect viral syndrome She does report dysuria and UA consistent with possible infection - we'll treat for UTI  IM meds for headache/nausea ordered They'll f/u on covid flu testing at home Labs personally reviewed, no evidence of anemia, dehydration, or WBC elevation. Doubt sepsis.  Headache seems tension type, waxning and waning, no red flags for SAH, meningitis, or other emergent condition     Final  diagnoses:  Urinary tract infection without hematuria, site unspecified  Viral illness    ED Discharge Orders          Ordered    cephALEXin  (KEFLEX ) 500 MG capsule  2 times daily        04/04/24 1539    ondansetron  (ZOFRAN -ODT) 4 MG disintegrating tablet  Every 8 hours PRN        04/04/24 1539               Berdella Bacot, Donnice PARAS, MD 04/04/24 1539

## 2024-04-04 NOTE — ED Triage Notes (Signed)
 Pt reports having headache, left ear pain, sore throat, and fever intermittently since this past Tuesday. Pt has been treating with over the counter meds, but feels like the symptoms are getting worse.

## 2024-04-05 LAB — URINE CULTURE: Culture: NO GROWTH

## 2024-06-24 ENCOUNTER — Telehealth: Payer: Self-pay

## 2024-06-24 NOTE — Telephone Encounter (Signed)
Left message for patient about upcoming appointment.

## 2024-06-25 ENCOUNTER — Inpatient Hospital Stay: Attending: Hematology and Oncology | Admitting: Hematology and Oncology

## 2024-07-25 ENCOUNTER — Emergency Department (HOSPITAL_COMMUNITY)

## 2024-07-25 ENCOUNTER — Emergency Department (HOSPITAL_COMMUNITY)
Admission: EM | Admit: 2024-07-25 | Discharge: 2024-07-26 | Disposition: A | Discharge status: Home / Self Care | Attending: Emergency Medicine | Admitting: Emergency Medicine

## 2024-07-25 DIAGNOSIS — S01112A Laceration without foreign body of left eyelid and periocular area, initial encounter: Secondary | ICD-10-CM | POA: Insufficient documentation

## 2024-07-25 DIAGNOSIS — S52125A Nondisplaced fracture of head of left radius, initial encounter for closed fracture: Secondary | ICD-10-CM | POA: Diagnosis not present

## 2024-07-25 DIAGNOSIS — Y92410 Unspecified street and highway as the place of occurrence of the external cause: Secondary | ICD-10-CM | POA: Insufficient documentation

## 2024-07-25 DIAGNOSIS — R1084 Generalized abdominal pain: Secondary | ICD-10-CM | POA: Insufficient documentation

## 2024-07-25 DIAGNOSIS — Z23 Encounter for immunization: Secondary | ICD-10-CM | POA: Diagnosis not present

## 2024-07-25 DIAGNOSIS — R519 Headache, unspecified: Secondary | ICD-10-CM | POA: Diagnosis present

## 2024-07-25 LAB — COMPREHENSIVE METABOLIC PANEL WITH GFR
ALT: 21 U/L (ref 0–44)
AST: 27 U/L (ref 15–41)
Albumin: 3.3 g/dL — ABNORMAL LOW (ref 3.5–5.0)
Alkaline Phosphatase: 54 U/L (ref 38–126)
Anion gap: 11 (ref 5–15)
BUN: 11 mg/dL (ref 6–20)
CO2: 22 mmol/L (ref 22–32)
Calcium: 8.8 mg/dL — ABNORMAL LOW (ref 8.9–10.3)
Chloride: 102 mmol/L (ref 98–111)
Creatinine, Ser: 0.68 mg/dL (ref 0.44–1.00)
GFR, Estimated: >60 mL/min (ref >60)
Glucose, Bld: 98 mg/dL (ref 70–99)
Potassium: 3.6 mmol/L (ref 3.5–5.1)
Sodium: 135 mmol/L (ref 135–145)
Total Bilirubin: 0.4 mg/dL (ref 0.0–1.2)
Total Protein: 6.9 g/dL (ref 6.5–8.1)

## 2024-07-25 LAB — CBC WITH DIFFERENTIAL/PLATELET
Abs Immature Granulocytes: 0.14 K/uL — ABNORMAL HIGH (ref 0.00–0.07)
Basophils Absolute: 0.1 K/uL (ref 0.0–0.1)
Basophils Relative: 0 %
Eosinophils Absolute: 0.1 K/uL (ref 0.0–0.5)
Eosinophils Relative: 0 %
HCT: 44 % (ref 36.0–46.0)
Hemoglobin: 14.4 g/dL (ref 12.0–15.0)
Immature Granulocytes: 1 %
Lymphocytes Relative: 7 %
Lymphs Abs: 1.3 K/uL (ref 0.7–4.0)
MCH: 28.7 pg (ref 26.0–34.0)
MCHC: 32.7 g/dL (ref 30.0–36.0)
MCV: 87.6 fL (ref 80.0–100.0)
Monocytes Absolute: 0.8 K/uL (ref 0.1–1.0)
Monocytes Relative: 4 %
Neutro Abs: 16.8 K/uL — ABNORMAL HIGH (ref 1.7–7.7)
Neutrophils Relative %: 88 %
Platelets: 298 K/uL (ref 150–400)
RBC: 5.02 MIL/uL (ref 3.87–5.11)
RDW: 12.7 % (ref 11.5–15.5)
WBC: 19.2 K/uL — ABNORMAL HIGH (ref 4.0–10.5)
nRBC: 0 % (ref 0.0–0.2)

## 2024-07-25 LAB — I-STAT CHEM 8, ED
BUN: 11 mg/dL (ref 6–20)
Calcium, Ion: 1.15 mmol/L (ref 1.15–1.40)
Chloride: 102 mmol/L (ref 98–111)
Creatinine, Ser: 0.7 mg/dL (ref 0.44–1.00)
Glucose, Bld: 102 mg/dL — ABNORMAL HIGH (ref 70–99)
HCT: 43 % (ref 36.0–46.0)
Hemoglobin: 14.6 g/dL (ref 12.0–15.0)
Potassium: 3.6 mmol/L (ref 3.5–5.1)
Sodium: 137 mmol/L (ref 135–145)
TCO2: 23 mmol/L (ref 22–32)

## 2024-07-25 LAB — LIPASE, BLOOD: Lipase: 29 U/L (ref 11–51)

## 2024-07-25 LAB — HCG, SERUM, QUALITATIVE: Preg, Serum: NEGATIVE

## 2024-07-25 MED ORDER — LIDOCAINE HCL 2 % IJ SOLN
5.0000 mL | Freq: Once | INTRAMUSCULAR | Status: AC
  Administered 2024-07-25: 100 mg
  Filled 2024-07-25: qty 20

## 2024-07-25 MED ORDER — MORPHINE SULFATE (PF) 4 MG/ML IV SOLN
4.0000 mg | Freq: Once | INTRAVENOUS | Status: AC
  Administered 2024-07-25: 4 mg via INTRAVENOUS
  Filled 2024-07-25: qty 1

## 2024-07-25 MED ORDER — ONDANSETRON HCL 4 MG/2ML IJ SOLN
4.0000 mg | Freq: Once | INTRAMUSCULAR | Status: AC
  Administered 2024-07-25: 4 mg via INTRAVENOUS
  Filled 2024-07-25: qty 2

## 2024-07-25 MED ORDER — TETANUS-DIPHTH-ACELL PERTUSSIS 5-2-15.5 LF-MCG/0.5 IM SUSP
0.5000 mL | Freq: Once | INTRAMUSCULAR | Status: AC
  Administered 2024-07-25: 0.5 mL via INTRAMUSCULAR
  Filled 2024-07-25: qty 0.5

## 2024-07-25 MED ORDER — IOHEXOL 350 MG/ML SOLN
75.0000 mL | Freq: Once | INTRAVENOUS | Status: AC | PRN
  Administered 2024-07-25: 75 mL via INTRAVENOUS

## 2024-07-25 MED ORDER — BACITRACIN ZINC 500 UNIT/GM EX OINT: TOPICAL_OINTMENT | Freq: Once | CUTANEOUS | Status: AC

## 2024-07-25 NOTE — ED Notes (Signed)
 Patient transported to CT
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
## 2024-07-25 NOTE — ED Notes (Signed)
 Ortho tech called for splint.

## 2024-07-25 NOTE — ED Notes (Signed)
 Patient returned from CT
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
## 2024-07-25 NOTE — ED Notes (Signed)
 Pt is now c/o pain in upper and lower extremities.

## 2024-07-26 MED ORDER — BACITRACIN ZINC 500 UNIT/GM EX OINT: 1.0000 | TOPICAL_OINTMENT | Freq: Three times a day (TID) | CUTANEOUS | 0 refills | Status: AC

## 2024-07-26 MED ORDER — CYCLOBENZAPRINE HCL 5 MG PO TABS: 10.0000 mg | ORAL_TABLET | Freq: Three times a day (TID) | ORAL | 0 refills | Status: AC | PRN

## 2024-07-26 NOTE — Progress Notes (Signed)
 Orthopedic Tech Progress Note Patient Details:  Karen Berry Aug 17, 1981 [id number]  Ortho Devices Type of Ortho Device: Arm sling, Post (long arm) splint Ortho Device/Splint Location: lue Ortho Device/Splint Interventions: Ordered, Adjustment, Application  I spoke with the dr before seeing the pt. They approved a posterior long arm splint. I applied the splint. Pt did well. Post Interventions Patient Tolerated: Well Instructions Provided: Care of device, Adjustment of device  Chandra Dorn PARAS 07/26/2024, 12:29 AM

## 2024-08-05 ENCOUNTER — Encounter (HOSPITAL_COMMUNITY): Payer: Self-pay

## 2024-08-05 ENCOUNTER — Emergency Department (HOSPITAL_COMMUNITY): Admission: EM | Admit: 2024-08-05 | Discharge: 2024-08-05 | Disposition: A | Discharge status: Home / Self Care

## 2024-08-05 ENCOUNTER — Other Ambulatory Visit: Payer: Self-pay

## 2024-08-05 DIAGNOSIS — Z4802 Encounter for removal of sutures: Secondary | ICD-10-CM | POA: Diagnosis present

## 2024-08-05 NOTE — ED Notes (Signed)
Left prior to receiving AVS

## 2024-08-05 NOTE — ED Provider Notes (Signed)
 [place] EMERGENCY DEPARTMENT AT [facility] Provider Note   CSN: [id number] Arrival date & time: 08/05/24  1637     Patient presents with: Suture / Staple Removal   Karen Berry is a 43 y.o. female.    Suture / Staple Removal Pertinent negatives include no chest pain, no abdominal pain and no shortness of breath.   Presents because of his suture removal.  No other complaints.  Previous medical history reviewed : Patient had sutures placed on the room 16 2025.  Left eyebrow. 3 sutures placed      Prior to Admission medications   Medication Sig Start Date End Date Taking? Authorizing Provider  bacitracin  ointment Apply 1 Application topically 3 (three) times daily. 07/25/24   Sharlet Dowdy, MD  levonorgestrel  (MIRENA ) 20 MCG/DAY IUD 1 each by Intrauterine route once. Placed 08/21/2011    [provider]  ondansetron  (ZOFRAN -ODT) 4 MG disintegrating tablet Take 1 tablet (4 mg total) by mouth every 8 (eight) hours as needed for up to 15 doses for nausea or vomiting. 04/04/24   Trifan, Donnice PARAS, MD  oxyCODONE  (OXY IR/ROXICODONE ) 5 MG immediate release tablet Take 1 tablet (5 mg total) by mouth every 6 (six) hours as needed for severe pain. 11/21/22   Belinda Donnice, MD    Allergies: Bovine (beef) protein-containing drug products and Porcine (pork) protein-containing drug products    Review of Systems  Constitutional:  Negative for chills and fever.  HENT:  Negative for ear pain and sore throat.   Eyes:  Negative for pain and visual disturbance.  Respiratory:  Negative for cough and shortness of breath.   Cardiovascular:  Negative for chest pain and palpitations.  Gastrointestinal:  Negative for abdominal pain and vomiting.  Genitourinary:  Negative for dysuria and hematuria.  Musculoskeletal:  Negative for arthralgias and back pain.  Skin:  Negative for color change and rash.  Neurological:  Negative for seizures and syncope.  All other systems reviewed  and are negative.   Updated Vital Signs BP 117/87 (BP Location: Right Arm)   Pulse 66   Temp 98 F (36.7 C)   Resp 16   Ht 5' 4 (1.626 m)   Wt 68 kg   SpO2 100%   BMI 25.75 kg/m   Physical Exam Vitals and nursing note reviewed.  Constitutional:      General: She is not in acute distress.    Appearance: She is well-developed.  HENT:     Head: Normocephalic and atraumatic.  Eyes:     Conjunctiva/sclera: Conjunctivae normal.  Cardiovascular:     Rate and Rhythm: Normal rate and regular rhythm.     Heart sounds: No murmur heard. Pulmonary:     Effort: Pulmonary effort is normal. No respiratory distress.     Breath sounds: Normal breath sounds.  Abdominal:     Palpations: Abdomen is soft.     Tenderness: There is no abdominal tenderness.  Musculoskeletal:        General: No swelling.     Cervical back: Neck supple.  Skin:    General: Skin is warm and dry.     Capillary Refill: Capillary refill takes less than 2 seconds.  Neurological:     Mental Status: She is alert.  Psychiatric:        Mood and Affect: Mood normal.     (all labs ordered are listed, but only abnormal results are displayed) Labs Reviewed - No data to display  EKG: None  Radiology: No  results found.   Suture Removal  Date/Time: 08/05/2024 8:06 PM  Performed by: Simon Lavonia SAILOR, MD Authorized by: Simon Lavonia SAILOR, MD   Consent:    Consent obtained:  Verbal   Consent given by:  Patient   Risks, benefits, and alternatives were discussed: yes     Risks discussed:  Bleeding, pain and wound separation   Alternatives discussed:  Alternative treatment, delayed treatment, no treatment, observation and referral Universal protocol:    Patient identity confirmed:  Verbally with patient Location:    Location:  Head/neck   Head/neck location:  Eyebrow   Eyebrow location:  L eyebrow Procedure details:    Wound appearance:  No signs of infection   Number of sutures removed:  3 Post-procedure  details:    Post-removal:  No dressing applied   Procedure completion:  Tolerated    Medications Ordered in the ED - No data to display                                  Medical Decision Making    HPI:   Presents because of his suture removal.  No other complaints.  Previous medical history reviewed : Patient had sutures placed on the room 16 2025.  Left eyebrow. 3 sutures placed    MDM:    I removed 3 sutures.  No complications from this.  No other complaints.  Discharge stable condition.         Final diagnoses:  Visit for suture removal    ED Discharge Orders     None          Simon Lavonia SAILOR, MD 08/05/24 2008

## 2024-08-05 NOTE — Discharge Instructions (Signed)
 Come back if you have any fever/swelling.

## 2024-08-05 NOTE — ED Triage Notes (Signed)
 Pt is here to remove stiches in left eyebrow. Aoxox4.
